# Patient Record
Sex: Female | Born: 1935 | Race: White | Hispanic: No | Marital: Married | Smoking: Former smoker
Health system: Southern US, Community
[De-identification: ages and names within clinical notes are randomized; demographics above are authoritative.]

## PROBLEM LIST (undated history)

## (undated) DIAGNOSIS — F0390 Unspecified dementia without behavioral disturbance: Secondary | ICD-10-CM

## (undated) DIAGNOSIS — G20 Parkinson's disease: Secondary | ICD-10-CM

## (undated) HISTORY — PX: BACK SURGERY: SHX140

## (undated) HISTORY — PX: HIP SURGERY: SHX245

## (undated) HISTORY — PX: KNEE SURGERY: SHX244

---

## 2016-03-11 ENCOUNTER — Inpatient Hospital Stay (HOSPITAL_COMMUNITY)
Admission: EM | Admit: 2016-03-11 | Discharge: 2016-03-17 | DRG: 871 | Disposition: A | Discharge status: Home / Self Care | Payer: Medicare (Managed Care) | Attending: Internal Medicine | Admitting: Internal Medicine

## 2016-03-11 ENCOUNTER — Encounter (HOSPITAL_COMMUNITY): Payer: Self-pay

## 2016-03-11 ENCOUNTER — Emergency Department (HOSPITAL_COMMUNITY): Payer: Medicare (Managed Care)

## 2016-03-11 DIAGNOSIS — Z87891 Personal history of nicotine dependence: Secondary | ICD-10-CM | POA: Diagnosis not present

## 2016-03-11 DIAGNOSIS — G20 Parkinson's disease: Secondary | ICD-10-CM | POA: Diagnosis present

## 2016-03-11 DIAGNOSIS — J189 Pneumonia, unspecified organism: Secondary | ICD-10-CM | POA: Diagnosis present

## 2016-03-11 DIAGNOSIS — I10 Essential (primary) hypertension: Secondary | ICD-10-CM | POA: Diagnosis present

## 2016-03-11 DIAGNOSIS — R778 Other specified abnormalities of plasma proteins: Secondary | ICD-10-CM | POA: Diagnosis present

## 2016-03-11 DIAGNOSIS — B9789 Other viral agents as the cause of diseases classified elsewhere: Secondary | ICD-10-CM | POA: Diagnosis present

## 2016-03-11 DIAGNOSIS — I5181 Takotsubo syndrome: Secondary | ICD-10-CM | POA: Diagnosis present

## 2016-03-11 DIAGNOSIS — R197 Diarrhea, unspecified: Secondary | ICD-10-CM | POA: Diagnosis present

## 2016-03-11 DIAGNOSIS — J810 Acute pulmonary edema: Secondary | ICD-10-CM | POA: Diagnosis present

## 2016-03-11 DIAGNOSIS — J811 Chronic pulmonary edema: Secondary | ICD-10-CM | POA: Diagnosis present

## 2016-03-11 DIAGNOSIS — A419 Sepsis, unspecified organism: Secondary | ICD-10-CM | POA: Diagnosis present

## 2016-03-11 DIAGNOSIS — Z79899 Other long term (current) drug therapy: Secondary | ICD-10-CM | POA: Diagnosis not present

## 2016-03-11 DIAGNOSIS — J9601 Acute respiratory failure with hypoxia: Secondary | ICD-10-CM | POA: Diagnosis present

## 2016-03-11 DIAGNOSIS — R6520 Severe sepsis without septic shock: Secondary | ICD-10-CM | POA: Diagnosis present

## 2016-03-11 DIAGNOSIS — I214 Non-ST elevation (NSTEMI) myocardial infarction: Secondary | ICD-10-CM | POA: Diagnosis present

## 2016-03-11 DIAGNOSIS — F0390 Unspecified dementia without behavioral disturbance: Secondary | ICD-10-CM | POA: Diagnosis not present

## 2016-03-11 DIAGNOSIS — E872 Acidosis, unspecified: Secondary | ICD-10-CM

## 2016-03-11 DIAGNOSIS — J9600 Acute respiratory failure, unspecified whether with hypoxia or hypercapnia: Secondary | ICD-10-CM

## 2016-03-11 DIAGNOSIS — D696 Thrombocytopenia, unspecified: Secondary | ICD-10-CM | POA: Diagnosis present

## 2016-03-11 DIAGNOSIS — E876 Hypokalemia: Secondary | ICD-10-CM | POA: Diagnosis present

## 2016-03-11 DIAGNOSIS — R7989 Other specified abnormal findings of blood chemistry: Secondary | ICD-10-CM | POA: Diagnosis not present

## 2016-03-11 DIAGNOSIS — F0280 Dementia in other diseases classified elsewhere without behavioral disturbance: Secondary | ICD-10-CM | POA: Diagnosis present

## 2016-03-11 DIAGNOSIS — A4189 Other specified sepsis: Principal | ICD-10-CM | POA: Diagnosis present

## 2016-03-11 DIAGNOSIS — R739 Hyperglycemia, unspecified: Secondary | ICD-10-CM | POA: Diagnosis present

## 2016-03-11 DIAGNOSIS — R05 Cough: Secondary | ICD-10-CM | POA: Diagnosis not present

## 2016-03-11 DIAGNOSIS — R0989 Other specified symptoms and signs involving the circulatory and respiratory systems: Secondary | ICD-10-CM | POA: Diagnosis present

## 2016-03-11 HISTORY — DX: Parkinson's disease: G20

## 2016-03-11 HISTORY — DX: Unspecified dementia, unspecified severity, without behavioral disturbance, psychotic disturbance, mood disturbance, and anxiety: F03.90

## 2016-03-11 LAB — CBC WITH DIFFERENTIAL/PLATELET
Basophils Absolute: 0 10*3/uL (ref 0.0–0.1)
Basophils Relative: 0 %
EOS PCT: 0 %
Eosinophils Absolute: 0 10*3/uL (ref 0.0–0.7)
HEMATOCRIT: 47.9 % — AB (ref 36.0–46.0)
Hemoglobin: 15.4 g/dL — ABNORMAL HIGH (ref 12.0–15.0)
LYMPHS ABS: 1.2 10*3/uL (ref 0.7–4.0)
LYMPHS PCT: 11 %
MCH: 29.3 pg (ref 26.0–34.0)
MCHC: 32.2 g/dL (ref 30.0–36.0)
MCV: 91.1 fL (ref 78.0–100.0)
MONO ABS: 0.7 10*3/uL (ref 0.1–1.0)
Monocytes Relative: 7 %
NEUTROS ABS: 9 10*3/uL — AB (ref 1.7–7.7)
Neutrophils Relative %: 82 %
PLATELETS: 161 10*3/uL (ref 150–400)
RBC: 5.26 MIL/uL — ABNORMAL HIGH (ref 3.87–5.11)
RDW: 14.9 % (ref 11.5–15.5)
WBC: 10.9 10*3/uL — ABNORMAL HIGH (ref 4.0–10.5)

## 2016-03-11 LAB — COMPREHENSIVE METABOLIC PANEL
ALK PHOS: 73 U/L (ref 38–126)
ALT: 15 U/L (ref 14–54)
ANION GAP: 11 (ref 5–15)
AST: 25 U/L (ref 15–41)
Albumin: 3.7 g/dL (ref 3.5–5.0)
BILIRUBIN TOTAL: 0.7 mg/dL (ref 0.3–1.2)
BUN: 17 mg/dL (ref 6–20)
CALCIUM: 9.3 mg/dL (ref 8.9–10.3)
CO2: 22 mmol/L (ref 22–32)
Chloride: 107 mmol/L (ref 101–111)
Creatinine, Ser: 0.76 mg/dL (ref 0.44–1.00)
Glucose, Bld: 164 mg/dL — ABNORMAL HIGH (ref 65–99)
Potassium: 3.5 mmol/L (ref 3.5–5.1)
Sodium: 140 mmol/L (ref 135–145)
TOTAL PROTEIN: 6.9 g/dL (ref 6.5–8.1)

## 2016-03-11 LAB — I-STAT ARTERIAL BLOOD GAS, ED
ACID-BASE DEFICIT: 7 mmol/L — AB (ref 0.0–2.0)
BICARBONATE: 19.6 meq/L — AB (ref 20.0–24.0)
O2 SAT: 97 %
PH ART: 7.267 — AB (ref 7.350–7.450)
PO2 ART: 101 mmHg — AB (ref 80.0–100.0)
Patient temperature: 98.6
TCO2: 21 mmol/L (ref 0–100)
pCO2 arterial: 43.1 mmHg (ref 35.0–45.0)

## 2016-03-11 LAB — URINALYSIS, ROUTINE W REFLEX MICROSCOPIC
Bilirubin Urine: NEGATIVE
GLUCOSE, UA: NEGATIVE mg/dL
KETONES UR: 15 mg/dL — AB
LEUKOCYTES UA: NEGATIVE
Nitrite: NEGATIVE
PH: 5.5 (ref 5.0–8.0)
Specific Gravity, Urine: 1.028 (ref 1.005–1.030)

## 2016-03-11 LAB — TROPONIN I
TROPONIN I: 2.33 ng/mL — AB (ref <0.031)
Troponin I: 0.08 ng/mL — ABNORMAL HIGH (ref <0.031)

## 2016-03-11 LAB — I-STAT CG4 LACTIC ACID, ED
LACTIC ACID, VENOUS: 4.65 mmol/L — AB (ref 0.5–2.0)
Lactic Acid, Venous: 3.17 mmol/L (ref 0.5–2.0)
Lactic Acid, Venous: 2.69 mmol/L (ref 0.5–2.0)

## 2016-03-11 LAB — CBC
HCT: 43.2 % (ref 36.0–46.0)
Hemoglobin: 13.7 g/dL (ref 12.0–15.0)
MCH: 28.6 pg (ref 26.0–34.0)
MCHC: 31.7 g/dL (ref 30.0–36.0)
MCV: 90.2 fL (ref 78.0–100.0)
PLATELETS: 134 10*3/uL — AB (ref 150–400)
RBC: 4.79 MIL/uL (ref 3.87–5.11)
RDW: 14.9 % (ref 11.5–15.5)
WBC: 13.7 10*3/uL — AB (ref 4.0–10.5)

## 2016-03-11 LAB — TYPE AND SCREEN
ABO/RH(D): B POS
ANTIBODY SCREEN: NEGATIVE

## 2016-03-11 LAB — LIPASE, BLOOD: Lipase: 26 U/L (ref 11–51)

## 2016-03-11 LAB — I-STAT TROPONIN, ED: TROPONIN I, POC: 0.85 ng/mL — AB (ref 0.00–0.08)

## 2016-03-11 LAB — URINE MICROSCOPIC-ADD ON: BACTERIA UA: NONE SEEN

## 2016-03-11 LAB — PROCALCITONIN: PROCALCITONIN: 1.66 ng/mL

## 2016-03-11 LAB — CREATININE, SERUM
CREATININE: 0.81 mg/dL (ref 0.44–1.00)
GFR calc Af Amer: >60 mL/min (ref >60)
GFR calc non Af Amer: >60 mL/min (ref >60)

## 2016-03-11 LAB — MAGNESIUM: MAGNESIUM: 1.8 mg/dL (ref 1.7–2.4)

## 2016-03-11 LAB — BRAIN NATRIURETIC PEPTIDE: B Natriuretic Peptide: 107.7 pg/mL — ABNORMAL HIGH (ref 0.0–100.0)

## 2016-03-11 LAB — PHOSPHORUS: Phosphorus: 3.5 mg/dL (ref 2.5–4.6)

## 2016-03-11 MED ORDER — SODIUM CHLORIDE 0.9 % IV BOLUS (SEPSIS)
1000.0000 mL | Freq: Once | INTRAVENOUS | Status: AC
  Administered 2016-03-11: 1000 mL via INTRAVENOUS

## 2016-03-11 MED ORDER — ASPIRIN 325 MG PO TABS: 325.0000 mg | ORAL_TABLET | Freq: Once | ORAL | Status: DC

## 2016-03-11 MED ORDER — DEXTROSE 5 % IV SOLN
1.0000 g | Freq: Once | INTRAVENOUS | Status: AC
  Administered 2016-03-11: 1 g via INTRAVENOUS
  Filled 2016-03-11: qty 10

## 2016-03-11 MED ORDER — METOPROLOL TARTRATE 5 MG/5ML IV SOLN
2.5000 mg | Freq: Four times a day (QID) | INTRAVENOUS | Status: DC
  Administered 2016-03-12 (×2): 2.5 mg via INTRAVENOUS
  Filled 2016-03-11 (×5): qty 5

## 2016-03-11 MED ORDER — INSULIN ASPART 100 UNIT/ML ~~LOC~~ SOLN: 0.0000 [IU] | Freq: Every day | SUBCUTANEOUS | Status: DC

## 2016-03-11 MED ORDER — VANCOMYCIN HCL 500 MG IV SOLR
500.0000 mg | Freq: Two times a day (BID) | INTRAVENOUS | Status: DC
  Administered 2016-03-12: 500 mg via INTRAVENOUS
  Filled 2016-03-11 (×2): qty 500

## 2016-03-11 MED ORDER — VANCOMYCIN HCL IN DEXTROSE 1-5 GM/200ML-% IV SOLN
1000.0000 mg | Freq: Once | INTRAVENOUS | Status: AC
  Administered 2016-03-12: 1000 mg via INTRAVENOUS
  Filled 2016-03-11: qty 200

## 2016-03-11 MED ORDER — ASPIRIN 300 MG RE SUPP: 150.0000 mg | Freq: Every day | RECTAL | Status: DC

## 2016-03-11 MED ORDER — HEPARIN (PORCINE) IN NACL 100-0.45 UNIT/ML-% IJ SOLN
750.0000 [IU]/h | INTRAMUSCULAR | Status: DC
  Administered 2016-03-11: 750 [IU]/h via INTRAVENOUS
  Filled 2016-03-11: qty 250

## 2016-03-11 MED ORDER — HEPARIN (PORCINE) IN NACL 100-0.45 UNIT/ML-% IJ SOLN
750.0000 [IU]/h | INTRAMUSCULAR | Status: DC
  Administered 2016-03-12: 750 [IU]/h via INTRAVENOUS
  Filled 2016-03-11 (×4): qty 250

## 2016-03-11 MED ORDER — METRONIDAZOLE IN NACL 5-0.79 MG/ML-% IV SOLN
500.0000 mg | Freq: Three times a day (TID) | INTRAVENOUS | Status: DC
  Administered 2016-03-11 – 2016-03-13 (×6): 500 mg via INTRAVENOUS
  Filled 2016-03-11 (×8): qty 100

## 2016-03-11 MED ORDER — NITROGLYCERIN IN D5W 200-5 MCG/ML-% IV SOLN
0.0000 ug/min | Freq: Once | INTRAVENOUS | Status: AC
  Administered 2016-03-11: 5 ug/min via INTRAVENOUS
  Filled 2016-03-11: qty 250

## 2016-03-11 MED ORDER — VANCOMYCIN 50 MG/ML ORAL SOLUTION
125.0000 mg | Freq: Four times a day (QID) | ORAL | Status: DC
  Administered 2016-03-12 (×2): 125 mg via ORAL
  Filled 2016-03-11 (×5): qty 2.5

## 2016-03-11 MED ORDER — DEXTROSE 5 % IV SOLN
500.0000 mg | INTRAVENOUS | Status: DC
  Administered 2016-03-11 – 2016-03-14 (×4): 500 mg via INTRAVENOUS
  Filled 2016-03-11 (×6): qty 500

## 2016-03-11 MED ORDER — LORAZEPAM 2 MG/ML IJ SOLN
1.0000 mg | Freq: Once | INTRAMUSCULAR | Status: AC
  Administered 2016-03-11: 1 mg via INTRAVENOUS
  Filled 2016-03-11: qty 1

## 2016-03-11 MED ORDER — HEPARIN BOLUS VIA INFUSION
3500.0000 [IU] | Freq: Once | INTRAVENOUS | Status: AC
  Administered 2016-03-11: 3500 [IU] via INTRAVENOUS
  Filled 2016-03-11: qty 3500

## 2016-03-11 MED ORDER — ACETAMINOPHEN 325 MG PO TABS
650.0000 mg | ORAL_TABLET | Freq: Once | ORAL | Status: AC | PRN
  Administered 2016-03-11: 650 mg via ORAL

## 2016-03-11 MED ORDER — SODIUM CHLORIDE 0.9 % IV SOLN: 250.0000 mL | INTRAVENOUS | Status: DC | PRN

## 2016-03-11 MED ORDER — INSULIN ASPART 100 UNIT/ML ~~LOC~~ SOLN: 0.0000 [IU] | Freq: Three times a day (TID) | SUBCUTANEOUS | Status: DC

## 2016-03-11 MED ORDER — FUROSEMIDE 10 MG/ML IJ SOLN
20.0000 mg | Freq: Once | INTRAMUSCULAR | Status: AC
  Administered 2016-03-11: 20 mg via INTRAVENOUS
  Filled 2016-03-11: qty 2

## 2016-03-11 MED ORDER — DEXTROSE 5 % IV SOLN
2.0000 g | Freq: Two times a day (BID) | INTRAVENOUS | Status: DC
  Administered 2016-03-12 – 2016-03-13 (×4): 2 g via INTRAVENOUS
  Filled 2016-03-11 (×5): qty 2

## 2016-03-11 MED ORDER — ENOXAPARIN SODIUM 40 MG/0.4ML ~~LOC~~ SOLN
40.0000 mg | SUBCUTANEOUS | Status: DC
  Filled 2016-03-11: qty 0.4

## 2016-03-11 MED ORDER — ASPIRIN 300 MG RE SUPP
150.0000 mg | Freq: Every day | RECTAL | Status: DC
  Administered 2016-03-12: 150 mg via RECTAL
  Filled 2016-03-11 (×2): qty 1

## 2016-03-11 MED ORDER — IOPAMIDOL (ISOVUE-370) INJECTION 76%
INTRAVENOUS | Status: AC
  Filled 2016-03-11: qty 100

## 2016-03-11 MED ORDER — ACETAMINOPHEN 325 MG PO TABS
ORAL_TABLET | ORAL | Status: AC
  Filled 2016-03-11: qty 2

## 2016-03-11 NOTE — ED Notes (Signed)
Asked MD to order antibiotics for CODE SEPSIS.

## 2016-03-11 NOTE — ED Notes (Signed)
Admitting md aware of elevated troponin and lactic acid.

## 2016-03-11 NOTE — Progress Notes (Signed)
RT spopke with MD about removing bipap and to see if pt can tolerate a [place].  Rt removed bipap and placed pt on a 3lpm [place].  Pt has been coughing with a congested cough but sats remain 99-100% on 3lpm.  Pt sounds as if she may have an upper airway obstruction from the sounds ausculated on the neck.  BS arer rhonchi and crackly.  RT will continue to monitor.

## 2016-03-11 NOTE — Progress Notes (Signed)
Pharmacy Antibiotic Note  Cassandra Tanner is a 82 y.o. female admitted on 03/11/2016 with sepsis.  Pharmacy has been consulted for vancomycin dosing.  Plan: Vancomycin 500mg  IV every 12 hours.  Goal trough 15-20 mcg/mL.  Cefepime 2g IV q12h Monitor culture data, renal function and clinical course VT at SS prn  Height: 5' (152.4 cm) Weight: 135 lb (61.236 kg) IBW/kg (Calculated) : 45.5  Temp (24hrs), Avg:101.4 F (38.6 C), Min:101 F (38.3 C), Max:101.7 F (38.7 C)   Recent Labs Lab 03/11/16 1514 03/11/16 1545 03/11/16 1739 03/11/16 2056  WBC 10.9*  --   --   --   CREATININE 0.76  --   --   --   LATICACIDVEN  --  3.17* 4.65* 2.69*    Estimated Creatinine Clearance: 43.6 mL/min (by C-G formula based on Cr of 0.76).    No Known Allergies  Antimicrobials this admission: Vanc 5/28 >>  Cefepime 5/28 >>  Flagyl 5/28 >> Azith 5/28 CTX 5/28  Dose adjustments this admission: n/a  Microbiology results: 5/28 BCx: sent 5/28 UCx: sent   Sputum:    MRSA PCR:   Thank you for allowing pharmacy to be a part of this patient's care.  Marquita PalmsBall, Tarl Cephas M 03/11/2016 10:32 PM

## 2016-03-11 NOTE — Progress Notes (Signed)
ANTICOAGULATION CONSULT NOTE - Initial Consult  Pharmacy Consult for heparin Indication: chest pain/ACS  No Known Allergies  Patient Measurements: Height: 5' (152.4 cm) Weight: 135 lb (61.236 kg) IBW/kg (Calculated) : 45.5 Heparin Dosing Weight: 58 kg   Vital Signs: Temp: 101.7 F (38.7 C) (05/28 1553) Temp Source: Rectal (05/28 1553) BP: 109/76 mmHg (05/28 1745) Pulse Rate: 130 (05/28 1745)  Labs:  Recent Labs  03/11/16 1514 03/11/16 1723  HGB 15.4*  --   HCT 47.9*  --   PLT 161  --   CREATININE 0.76  --   TROPONINI  --  0.08*    Estimated Creatinine Clearance: 43.6 mL/min (by C-G formula based on Cr of 0.76).   Medical History: Past Medical History  Diagnosis Date  . Parkinson's disease (HCC)   . Dementia     Medications:   (Not in a hospital admission)  Assessment: Starting heparin gtt for ?ACS CBC stable Renal fx stable, eCrCl 40-50 ml/min LA 4.6, trop positive  Goal of Therapy:  Heparin level 0.3-0.7 units/ml Monitor platelets by anticoagulation protocol: Yes    Plan:  -Heparin 3500 units x1 then 750 units/hr -Daily HL, CBC -First level with AM labs -F/u cardiology plans    Baldemar FridayMasters, Lajuanda Penick M 03/11/2016,5:57 PM

## 2016-03-11 NOTE — ED Notes (Signed)
Aggie Cosierheresa from [facility] called with Lactic acid 3.17, Dr. Clayborne DanaMesner called and made aware.

## 2016-03-11 NOTE — ED Notes (Signed)
Code Sepsis activated @ 1555

## 2016-03-11 NOTE — ED Notes (Signed)
Spoke with [facility], regarding troponin.

## 2016-03-11 NOTE — ED Notes (Signed)
Critical care md at bedside  

## 2016-03-11 NOTE — ED Provider Notes (Signed)
CSN: [id number]     Arrival date & time 03/11/16  1443 History   First MD Initiated Contact with Patient 03/11/16 1525     Chief Complaint  Patient presents with  . Cough    Patient is a 82 y.o. female presenting with cough and shortness of breath. The history is provided by the patient and the spouse.  Cough Cough characteristics:  Productive Sputum characteristics:  Clear Severity:  Moderate Onset quality:  Gradual Duration:  2 days Timing:  Constant Progression:  Worsening Chronicity:  New Smoker: no   Context comment:  Immobilization, flight, hotel Associated symptoms: chills, fever, myalgias and shortness of breath   Associated symptoms: no chest pain, no diaphoresis, no headaches, no rash, no rhinorrhea, no sore throat and no wheezing   Risk factors: recent travel (flew from [place] prior to onset)   Risk factors: no recent infection   Shortness of Breath Severity:  Moderate Duration:  2 days Timing:  Constant Progression:  Worsening Chronicity:  New Associated symptoms: cough and fever   Associated symptoms: no abdominal pain, no chest pain, no diaphoresis, no headaches, no rash, no sore throat, no vomiting and no wheezing   Risk factors: prolonged immobilization   Risk factors: no family hx of DVT, no hx of cancer and no hx of PE/DVT   Risk factors comment:  Parkinsons, dementia   Past Medical History  Diagnosis Date  . Parkinson's disease (HCC)   . Dementia    Past Surgical History  Procedure Laterality Date  . Back surgery    . Hip surgery    . Knee surgery     No family history on file. Social History  Substance Use Topics  . Smoking status: Not on file  . Smokeless tobacco: Not on file  . Alcohol Use: Not on file   OB History    No data available     Review of Systems  Constitutional: Positive for fever, chills and fatigue. Negative for diaphoresis, activity change and appetite change.  HENT: Negative for congestion, rhinorrhea and sore throat.    Respiratory: Positive for cough and shortness of breath. Negative for wheezing.   Cardiovascular: Negative for chest pain and leg swelling.  Gastrointestinal: Negative for nausea, vomiting and abdominal pain.  Genitourinary: Positive for dysuria. Negative for frequency.       Strong odor   Musculoskeletal: Positive for myalgias. Negative for back pain.  Skin: Negative for rash.  Neurological: Negative for syncope, weakness and headaches.  Psychiatric/Behavioral: Negative for confusion (baseline dementia with short term memory impairments.  At baseline).  All other systems reviewed and are negative.     Allergies  Review of patient's allergies indicates no known allergies.  Home Medications   Prior to Admission medications   Medication Sig Start Date End Date Taking? Authorizing Provider  Cholecalciferol (VITAMIN D-3) 1000 units CAPS Take 1,000 Units by mouth every morning.   Yes Historical Provider, MD  donepezil (ARICEPT) 5 MG tablet Take 5 mg by mouth at bedtime.   Yes Historical Provider, MD  escitalopram (LEXAPRO) 10 MG tablet Take 10 mg by mouth at bedtime.   Yes Historical Provider, MD  gabapentin (NEURONTIN) 300 MG capsule Take 300 mg by mouth at bedtime.   Yes Historical Provider, MD  vitamin C (ASCORBIC ACID) 500 MG tablet Take 500 mg by mouth every morning.   Yes Historical Provider, MD   BP 110/70 mmHg  Pulse 97  Temp(Src) 98.3 F (36.8 C) (Oral)  Resp 21  Ht 5' (1.524 m)  Wt 61.236 kg  BMI 26.37 kg/m2  SpO2 97% Physical Exam  Constitutional: She is oriented to person, place, and time. No distress.  elderly  HENT:  Head: Normocephalic and atraumatic.  Nose: Nose normal.  Eyes: Conjunctivae are normal.  Neck: Normal range of motion. Neck supple. No JVD present. No tracheal deviation present.  Cardiovascular: Regular rhythm, normal heart sounds and intact distal pulses.   No murmur heard. tachy  Pulmonary/Chest: She has no wheezes. She has rales (bilateral  diffusely).  Tachypneic (mild), nml exp phase, does not appear labored  Abdominal: Soft. Bowel sounds are normal. She exhibits no distension and no mass. There is no tenderness.  Musculoskeletal: Normal range of motion. She exhibits no edema.  No lower extremity edema, calf tenderness, warmth, erythema or palpable cords   Neurological: She is alert and oriented to person, place, and time.  MAE  Skin: Skin is warm and dry. No rash noted.  Psychiatric: She has a normal mood and affect.  Nursing note and vitals reviewed.   ED Course  Procedures (including critical care time) Labs Review Labs Reviewed  COMPREHENSIVE METABOLIC PANEL - Abnormal; Notable for the following:    Glucose, Bld 164 (*)    All other components within normal limits  CBC WITH DIFFERENTIAL/PLATELET - Abnormal; Notable for the following:    WBC 10.9 (*)    RBC 5.26 (*)    Hemoglobin 15.4 (*)    HCT 47.9 (*)    Neutro Abs 9.0 (*)    All other components within normal limits  URINALYSIS, ROUTINE W REFLEX MICROSCOPIC (NOT AT [facility]) - Abnormal; Notable for the following:    APPearance CLOUDY (*)    Hgb urine dipstick SMALL (*)    Ketones, ur 15 (*)    Protein, ur >300 (*)    All other components within normal limits  BRAIN NATRIURETIC PEPTIDE - Abnormal; Notable for the following:    B Natriuretic Peptide 107.7 (*)    All other components within normal limits  TROPONIN I - Abnormal; Notable for the following:    Troponin I 0.08 (*)    All other components within normal limits  URINE MICROSCOPIC-ADD ON - Abnormal; Notable for the following:    Squamous Epithelial / LPF 0-5 (*)    Casts HYALINE CASTS (*)    All other components within normal limits  TROPONIN I - Abnormal; Notable for the following:    Troponin I 2.33 (*)    All other components within normal limits  CBC - Abnormal; Notable for the following:    WBC 13.7 (*)    Platelets 134 (*)    All other components within normal limits  I-STAT CG4 LACTIC  ACID, ED - Abnormal; Notable for the following:    Lactic Acid, Venous 3.17 (*)    All other components within normal limits  I-STAT TROPOININ, ED - Abnormal; Notable for the following:    Troponin i, poc 0.85 (*)    All other components within normal limits  I-STAT CG4 LACTIC ACID, ED - Abnormal; Notable for the following:    Lactic Acid, Venous 4.65 (*)    All other components within normal limits  I-STAT CG4 LACTIC ACID, ED - Abnormal; Notable for the following:    Lactic Acid, Venous 2.69 (*)    All other components within normal limits  I-STAT ARTERIAL BLOOD GAS, ED - Abnormal; Notable for the following:    pH, Arterial 7.267 (*)  pO2, Arterial 101.0 (*)    Bicarbonate 19.6 (*)    Acid-base deficit 7.0 (*)    All other components within normal limits  CULTURE, BLOOD (ROUTINE X 2)  CULTURE, BLOOD (ROUTINE X 2)  URINE CULTURE  C DIFFICILE QUICK SCREEN W PCR REFLEX  GASTROINTESTINAL PANEL BY PCR, STOOL (REPLACES STOOL CULTURE)  MRSA PCR SCREENING  PROCALCITONIN  CREATININE, SERUM  MAGNESIUM  PHOSPHORUS  LIPASE, BLOOD  BLOOD GAS, ARTERIAL  CBC  BASIC METABOLIC PANEL  MAGNESIUM  PHOSPHORUS  FECAL LACTOFERRIN  TROPONIN I  TROPONIN I  HEPARIN LEVEL (UNFRACTIONATED)  TROPONIN I  I-STAT CG4 LACTIC ACID, ED  TYPE AND SCREEN  ABO/RH    Imaging Review Dg Chest 2 View  03/11/2016  CLINICAL DATA:  possible sepsis EXAM: CHEST  2 VIEW COMPARISON:  None. FINDINGS: Lumbar spine fixation. Cervical spine fixation. Midline trachea. Borderline cardiomegaly. Atherosclerosis in the transverse aorta. No pleural effusion or pneumothorax. Mild biapical pleural thickening. Diffuse pulmonary interstitial thickening. Suspicion of more focal inferior right upper lobe patchy airspace disease. IMPRESSION: Possible posterior right upper lobe airspace disease. This is superimposed upon presumably chronic nonspecific interstitial thickening. Depending on clinical symptoms, consider radiographic  follow-up in 2-4 days. Borderline cardiomegaly with aortic atherosclerosis. Electronically Signed   By: Jeronimo GreavesKyle  Talbot M.D.   On: 03/11/2016 15:54   Ct Angio Chest Pe W/cm &/or Wo Cm  03/11/2016  CLINICAL DATA:  Acute onset of congestion, cough, shortness of breath and fever. Initial encounter. EXAM: CT ANGIOGRAPHY CHEST WITH CONTRAST TECHNIQUE: Multidetector CT imaging of the chest was performed using the standard protocol during bolus administration of intravenous contrast. Multiplanar CT image reconstructions and MIPs were obtained to evaluate the vascular anatomy. CONTRAST:  100 mL of Isovue 300 IV contrast COMPARISON:  Chest radiograph performed earlier today at 5:22 p.m. FINDINGS: There is no evidence of pulmonary embolus. Minimal bilateral atelectasis is noted. The lungs are otherwise clear. A prominent bleb is noted at the left upper lobe. There is no evidence of significant focal consolidation, pleural effusion or pneumothorax. No masses are identified; no abnormal focal contrast enhancement is seen. Mild peribronchial thickening is noted, without a dominant mass. The mediastinum is grossly unremarkable in appearance. Visualized mediastinal nodes remain normal in size. Scattered calcification is seen along the aortic arch and descending thoracic aorta. The great vessels are grossly unremarkable in appearance. No axillary lymphadenopathy is seen. The visualized portions of the thyroid gland are unremarkable in appearance. The visualized portions of the liver and spleen are unremarkable. The patient is status post cholecystectomy, with clips noted at the gallbladder fossa. The visualized portions of the pancreas, adrenal glands and right kidney are grossly unremarkable. No acute osseous abnormalities are seen. Lumbar spinal fusion hardware is partially imaged, somewhat superiorly angulated. Multilevel vacuum phenomenon is noted along the lower thoracic spine. Review of the MIP images confirms the above  findings. IMPRESSION: 1. No evidence of pulmonary embolus. 2. Minimal bilateral atelectasis noted. Lungs otherwise clear. Prominent bleb at the left upper lobe. 3. Mild peribronchial thickening noted. 4. Scattered calcification along the aortic arch and descending thoracic aorta. 5. Degenerative change along the lower thoracic spine. Electronically Signed   By: Roanna RaiderJeffery  Chang M.D.   On: 03/11/2016 19:04   Dg Chest Portable 1 View  03/11/2016  CLINICAL DATA:  Shortness of Breath EXAM: PORTABLE CHEST 1 VIEW COMPARISON:  03/11/2016 FINDINGS: Cardiac shadow is stable. The lungs are again well aerated. Increasing vascular congestion with new mild interstitial edema is  noted. There is some improved aeration in the right upper lobe when compared with the prior exam. Postsurgical changes are again seen. IMPRESSION: Increased vascular congestion with interstitial edema. Electronically Signed   By: Alcide Clever M.D.   On: 03/11/2016 17:40   I have personally reviewed and evaluated these images and lab results as part of my medical decision-making.   EKG Interpretation   Date/Time:  Sunday Mar 11 2016 14:49:18 EDT Ventricular Rate:  163 PR Interval:  80 QRS Duration: 6 QT Interval:  112 QTC Calculation: 184 R Axis:   0 Text Interpretation:  Undetermined rhythm Indeterminate axis Pulmonary  disease pattern Nonspecific ST and T wave abnormality Abnormal ECG  Confirmed by [facility] MD, Barbara Cower [phone]) on 03/11/2016 3:33:50 PM      MDM   Final diagnoses:  Sepsis, due to unspecified organism (HCC)  Flash pulmonary edema (HCC)  NSTEMI (non-ST elevated myocardial infarction) (HCC)  Lactic acidosis  Acute hypoxemic respiratory failure (HCC)   Meets SIRS criteria.  Not labored. Dementia with short term memory impairments, oriented x, at baseline.  +resp symptoms. Consider PE given recent immobilization, tachy, hypoxia. CXR wo consolidation, but some interstitial markings/ fluid could be related to PNA, will  have better idea after PE study.  Hypoxic in high 80s, placed on 2L [place]. Does not appear volume up, no JVD, leg swelling and no hx of heart problems.    After 1L NS, pt developed flash pulm edema with HTN, significant rales, resp distress and hypoxia.  Placed on NIPPV and NTG with lasix which improved pt clinical status.  Was able to take off NTG and BIPAP.    CTA study neg for PE/ PNA.  Consider cardiogenic source given elevated trop and acute pulm edema.  Started hep gtt.  EKG wo STEMI.  No chest pain.  Lactic acid trended up, source not definitively clear. Bacteremia? UA neg for infx markers. Abd nontender.  Given abx early in course. Defer 30 cckg bolus for pulm edema, unable to tolerate more fluid.  Critical care consulted and will admit.    Sofie Rower, MD 03/12/16 [id number]  Marily Memos, MD 03/12/16 2020

## 2016-03-11 NOTE — H&P (Addendum)
PULMONARY / CRITICAL CARE MEDICINE   Name: Cassandra Tanner MRN: [id number] DOB: 1930/01/28    ADMISSION DATE:  03/11/2016  CHIEF COMPLAINT:  Dyspnea  HISTORY OF PRESENT ILLNESS:   Cassandra [place] is an 82 y/o [place] when she developed worsening dyspnea, fevers, altered mental status, and abdominal pain. Her husband reports she was in her usual state of health with no recent changes until about 48 hours prior to presentation when she developed diarrhea with loose BMs for several days. She reported abd pain with poor PO intake for about 24 hours prior to presentation. She also developed "gurgling" sounds while breathing, and had increased WOB. Her family was worried and brought to the ED.  PAST MEDICAL HISTORY :  She  has a past medical history of Parkinson's disease (HCC) and Dementia.  PAST SURGICAL HISTORY: She  has past surgical history that includes Back surgery; Hip surgery; and Knee surgery.  No Known Allergies  No current facility-administered medications on file prior to encounter.   No current outpatient prescriptions on file prior to encounter.    FAMILY HISTORY:  Her has no family status information on file.   SOCIAL HISTORY: She lives in an assisted living community.  REVIEW OF SYSTEMS:   Her family reports she had been complaining of abd pain and loose BMs for last several days.  SUBJECTIVE:  N/A  VITAL SIGNS: BP 106/77 mmHg  Pulse 94  Temp(Src) 101.7 F (38.7 C) (Rectal)  Resp 23  Ht 5' (1.524 m)  Wt 135 lb (61.236 kg)  BMI 26.37 kg/m2  SpO2 98%  HEMODYNAMICS:    VENTILATOR SETTINGS: Vent Mode:  [-] BIPAP FiO2 (%):  [40 %] 40 %  INTAKE / OUTPUT:    PHYSICAL EXAMINATION: General:  Elderly woman, somnolent, but easily arousable. Neuro:  Oriented to self only. Difficulty hearing. HEENT:  Dried secretions on lips, foul-smelling breath Cardiovascular:  Heart sounds dual and normal. Lungs:   CTAB. Abdomen:  Soft, mildly tender Musculoskeletal:  No swollen joints Skin:  No visible rashes.  LABS:  BMET  Recent Labs Lab 03/11/16 1514  NA 140  K 3.5  CL 107  CO2 22  BUN 17  CREATININE 0.76  GLUCOSE 164*    Electrolytes  Recent Labs Lab 03/11/16 1514  CALCIUM 9.3    CBC  Recent Labs Lab 03/11/16 1514  WBC 10.9*  HGB 15.4*  HCT 47.9*  PLT 161    Coag's No results for input(s): APTT, INR in the last 168 hours.  Sepsis Markers  Recent Labs Lab 03/11/16 1545 03/11/16 1739 03/11/16 2056  LATICACIDVEN 3.17* 4.65* 2.69*    ABG  Recent Labs Lab 03/11/16 2036  PHART 7.267*  PCO2ART 43.1  PO2ART 101.0*    Liver Enzymes  Recent Labs Lab 03/11/16 1514  AST 25  ALT 15  ALKPHOS 73  BILITOT 0.7  ALBUMIN 3.7    Cardiac Enzymes  Recent Labs Lab 03/11/16 1723 03/11/16 2050  TROPONINI 0.08* 2.33*    Glucose No results for input(s): GLUCAP in the last 168 hours.  Imaging Dg Chest 2 View  03/11/2016  CLINICAL DATA:  possible sepsis EXAM: CHEST  2 VIEW COMPARISON:  None. FINDINGS: Lumbar spine fixation. Cervical spine fixation. Midline trachea. Borderline cardiomegaly. Atherosclerosis in the transverse aorta. No pleural effusion or pneumothorax. Mild biapical pleural thickening. Diffuse pulmonary interstitial thickening. Suspicion of more focal inferior right upper lobe patchy airspace disease. IMPRESSION: Possible posterior right  upper lobe airspace disease. This is superimposed upon presumably chronic nonspecific interstitial thickening. Depending on clinical symptoms, consider radiographic follow-up in 2-4 days. Borderline cardiomegaly with aortic atherosclerosis. Electronically Signed   By: Jeronimo Greaves M.D.   On: 03/11/2016 15:54   Ct Angio Chest Pe W/cm &/or Wo Cm  03/11/2016  CLINICAL DATA:  Acute onset of congestion, cough, shortness of breath and fever. Initial encounter. EXAM: CT ANGIOGRAPHY CHEST WITH CONTRAST TECHNIQUE:  Multidetector CT imaging of the chest was performed using the standard protocol during bolus administration of intravenous contrast. Multiplanar CT image reconstructions and MIPs were obtained to evaluate the vascular anatomy. CONTRAST:  100 mL of Isovue 300 IV contrast COMPARISON:  Chest radiograph performed earlier today at 5:22 p.m. FINDINGS: There is no evidence of pulmonary embolus. Minimal bilateral atelectasis is noted. The lungs are otherwise clear. A prominent bleb is noted at the left upper lobe. There is no evidence of significant focal consolidation, pleural effusion or pneumothorax. No masses are identified; no abnormal focal contrast enhancement is seen. Mild peribronchial thickening is noted, without a dominant mass. The mediastinum is grossly unremarkable in appearance. Visualized mediastinal nodes remain normal in size. Scattered calcification is seen along the aortic arch and descending thoracic aorta. The great vessels are grossly unremarkable in appearance. No axillary lymphadenopathy is seen. The visualized portions of the thyroid gland are unremarkable in appearance. The visualized portions of the liver and spleen are unremarkable. The patient is status post cholecystectomy, with clips noted at the gallbladder fossa. The visualized portions of the pancreas, adrenal glands and right kidney are grossly unremarkable. No acute osseous abnormalities are seen. Lumbar spinal fusion hardware is partially imaged, somewhat superiorly angulated. Multilevel vacuum phenomenon is noted along the lower thoracic spine. Review of the MIP images confirms the above findings. IMPRESSION: 1. No evidence of pulmonary embolus. 2. Minimal bilateral atelectasis noted. Lungs otherwise clear. Prominent bleb at the left upper lobe. 3. Mild peribronchial thickening noted. 4. Scattered calcification along the aortic arch and descending thoracic aorta. 5. Degenerative change along the lower thoracic spine. Electronically  Signed   By: Roanna Raider M.D.   On: 03/11/2016 19:04   Dg Chest Portable 1 View  03/11/2016  CLINICAL DATA:  Shortness of Breath EXAM: PORTABLE CHEST 1 VIEW COMPARISON:  03/11/2016 FINDINGS: Cardiac shadow is stable. The lungs are again well aerated. Increasing vascular congestion with new mild interstitial edema is noted. There is some improved aeration in the right upper lobe when compared with the prior exam. Postsurgical changes are again seen. IMPRESSION: Increased vascular congestion with interstitial edema. Electronically Signed   By: Alcide Clever M.D.   On: 03/11/2016 17:40     STUDIES:  CT-PA: No PE, no infiltrate  CULTURES: Blood x 2 5/28 >> In process   ANTIBIOTICS: CTX 5/28 x1 Azithro 5/28 >> PO Vanc 5/28 >> IV Flagyl 5/28 >> Cefepime 5/29 >>  SIGNIFICANT EVENTS: She developed flash pulmonary edema in the ED, resolved  LINES/TUBES: PIV x2   DISCUSSION: 82 y/o woman with sepsis of unclear source, favor GI  ASSESSMENT / PLAN:  PULMONARY A: Increased WOB Relative respiratory acidosis P:   Unable to compensate for metabolic acidosis (lactic acidosis). Able to cough and presently demonstrating ability to protect airway, but course, audible secretions with heavy breathing concerning for threatened ability to protect airway. Low threshold to intubate. Discussed with family. Supplemental O2 via [place] for now.  CARDIOVASCULAR A:  NSTEMI type II Flash pulmonary edmea P:  No  hx of cardiac dz, but evidence of athroslcerotic dz on CT. Pt had clear episode of flash pulmonary edema which could easily account for degree of troponinemia (~2.0), but rest of clinical history does not fit with primary issue being AMI. Will trend troponin. Obtain echo. Consider cardiology consultation. Treat with ASA given low risk and potential benefit (even if small). However, will not tx with heparin given greater compared to ASA.  RENAL A:   Metabolic acidosis P:   Due to lactic acidosis  due to severe sepsis.  GASTROINTESTINAL A:   Possible infectious diarrhea P:   Empiric tx for C. Diff GI pathogen panel Enteric precautions. Maintain NPO status for now until more stablized  HEMATOLOGIC A:   Tx with Lovenox for DVT ppx P:  Holding heparin infusion (for NSTEMI given likely demand)  INFECTIOUS A:   Severe sepsis P:   Lactic acid initially uptrending, now improving. Favor GI source, poss C. Diff, although she has no recent abx exposure, she does live in a ALF with exposure to other elderly individuals. Empiric tx as above (Flagyl + PO Vanc) as well as broad spectrum abx (IV vanc + cefepime) until more data from cx and stool samples results.  ENDOCRINE A:   No active issues P:    NEUROLOGIC A:   Dementia P:   High risk for delirium. Will monitor   FAMILY  - Updates: Updated at bedside - Inter-disciplinary family meet or Palliative Care meeting due by:  June 5  CRITICAL CARE Performed by: Jamie Kato   Total critical care time: 60 minutes  Critical care time was exclusive of separately billable procedures and treating other patients.  Critical care was necessary to treat or prevent imminent or life-threatening deterioration.  Critical care was time spent personally by me on the following activities: development of treatment plan with patient and/or surrogate as well as nursing, discussions with consultants, evaluation of patient's response to treatment, examination of patient, obtaining history from patient or surrogate, ordering and performing treatments and interventions, ordering and review of laboratory studies, ordering and review of radiographic studies, pulse oximetry and re-evaluation of patient's condition.   Jamie Kato, MD Pulmonary and Critical Care Medicine [facility] Pager: [phone]  03/11/2016, 10:14 PM

## 2016-03-11 NOTE — Progress Notes (Addendum)
eLink Physician-Brief Progress Note Patient Name: Cassandra T[place] DOB: [birth year] MRN: [id number]   Date of Service  03/11/2016  HPI/Events of Note  Troponin = 0.08 >> 2.33. EKG with nonspecific ST-T changes. Findings c/w NSTEMI vs demand ischemia.   eICU Interventions  Will order: 1. ASA Suppository 150 mg PR now and Q day. 2. Heparin IV infusion per pharmacy consult. 3. Metoprolol 2.5 mg IV Q 6 hours. Hold for SBP < 100 or HR < 60. 4. Consult Cardiology/     Intervention Category Intermediate Interventions: Diagnostic test evaluation  Cassandra [place] 03/11/2016, 11:30 PM

## 2016-03-11 NOTE — ED Notes (Signed)
Attempted report 

## 2016-03-11 NOTE — ED Notes (Signed)
Report called to Willette AlmaJenn M on 2 MW

## 2016-03-11 NOTE — ED Notes (Signed)
Pt reports onset yesterday congested cough, shortness of breath, fever.  Pt and husband flew in from [place] yesterday.  Pt was well 2 days ago.

## 2016-03-12 ENCOUNTER — Inpatient Hospital Stay (HOSPITAL_COMMUNITY): Payer: Medicare (Managed Care)

## 2016-03-12 ENCOUNTER — Encounter (HOSPITAL_COMMUNITY): Payer: Self-pay | Admitting: Physician Assistant

## 2016-03-12 DIAGNOSIS — I5181 Takotsubo syndrome: Secondary | ICD-10-CM

## 2016-03-12 DIAGNOSIS — A419 Sepsis, unspecified organism: Secondary | ICD-10-CM

## 2016-03-12 DIAGNOSIS — I214 Non-ST elevation (NSTEMI) myocardial infarction: Secondary | ICD-10-CM | POA: Diagnosis present

## 2016-03-12 DIAGNOSIS — R7989 Other specified abnormal findings of blood chemistry: Secondary | ICD-10-CM | POA: Diagnosis present

## 2016-03-12 DIAGNOSIS — J810 Acute pulmonary edema: Secondary | ICD-10-CM | POA: Diagnosis present

## 2016-03-12 DIAGNOSIS — R778 Other specified abnormalities of plasma proteins: Secondary | ICD-10-CM | POA: Diagnosis present

## 2016-03-12 LAB — BASIC METABOLIC PANEL
ANION GAP: 9 (ref 5–15)
BUN: 17 mg/dL (ref 6–20)
CALCIUM: 8.1 mg/dL — AB (ref 8.9–10.3)
CO2: 21 mmol/L — ABNORMAL LOW (ref 22–32)
Chloride: 110 mmol/L (ref 101–111)
Creatinine, Ser: 0.75 mg/dL (ref 0.44–1.00)
GLUCOSE: 141 mg/dL — AB (ref 65–99)
Potassium: 3.7 mmol/L (ref 3.5–5.1)
Sodium: 140 mmol/L (ref 135–145)

## 2016-03-12 LAB — PROCALCITONIN: Procalcitonin: 2.27 ng/mL

## 2016-03-12 LAB — RESPIRATORY PANEL BY PCR
Adenovirus: NOT DETECTED
Bordetella pertussis: NOT DETECTED
CORONAVIRUS 229E-RVPPCR: NOT DETECTED
CORONAVIRUS HKU1-RVPPCR: NOT DETECTED
CORONAVIRUS OC43-RVPPCR: NOT DETECTED
Chlamydophila pneumoniae: NOT DETECTED
Coronavirus NL63: NOT DETECTED
INFLUENZA A H1 2009-RVPPR: NOT DETECTED
INFLUENZA A H3-RVPPCR: NOT DETECTED
INFLUENZA B-RVPPCR: NOT DETECTED
Influenza A H1: NOT DETECTED
Influenza A: NOT DETECTED
METAPNEUMOVIRUS-RVPPCR: NOT DETECTED
MYCOPLASMA PNEUMONIAE-RVPPCR: NOT DETECTED
PARAINFLUENZA VIRUS 1-RVPPCR: NOT DETECTED
PARAINFLUENZA VIRUS 2-RVPPCR: NOT DETECTED
Parainfluenza Virus 3: DETECTED — AB
Parainfluenza Virus 4: NOT DETECTED
Respiratory Syncytial Virus: NOT DETECTED
Rhinovirus / Enterovirus: NOT DETECTED

## 2016-03-12 LAB — GLUCOSE, CAPILLARY
GLUCOSE-CAPILLARY: 120 mg/dL — AB (ref 65–99)
GLUCOSE-CAPILLARY: 149 mg/dL — AB (ref 65–99)
GLUCOSE-CAPILLARY: 95 mg/dL (ref 65–99)
GLUCOSE-CAPILLARY: 98 mg/dL (ref 65–99)
Glucose-Capillary: 138 mg/dL — ABNORMAL HIGH (ref 65–99)

## 2016-03-12 LAB — TROPONIN I
TROPONIN I: 4 ng/mL — AB (ref <0.031)
TROPONIN I: 3.79 ng/mL — AB (ref <0.031)
Troponin I: 3.12 ng/mL (ref <0.031)

## 2016-03-12 LAB — CBC
HCT: 40.3 % (ref 36.0–46.0)
Hemoglobin: 12.8 g/dL (ref 12.0–15.0)
MCH: 28.6 pg (ref 26.0–34.0)
MCHC: 31.8 g/dL (ref 30.0–36.0)
MCV: 90.2 fL (ref 78.0–100.0)
PLATELETS: 140 10*3/uL — AB (ref 150–400)
RBC: 4.47 MIL/uL (ref 3.87–5.11)
RDW: 15 % (ref 11.5–15.5)
WBC: 10.8 10*3/uL — AB (ref 4.0–10.5)

## 2016-03-12 LAB — C DIFFICILE QUICK SCREEN W PCR REFLEX
C DIFFICILE (CDIFF) TOXIN: NEGATIVE
C Diff antigen: NEGATIVE
C Diff interpretation: NEGATIVE

## 2016-03-12 LAB — URINE CULTURE: Culture: NO GROWTH

## 2016-03-12 LAB — ABO/RH: ABO/RH(D): B POS

## 2016-03-12 LAB — MAGNESIUM: Magnesium: 1.7 mg/dL (ref 1.7–2.4)

## 2016-03-12 LAB — ECHOCARDIOGRAM COMPLETE
Height: 62 in
WEIGHTICAEL: 2377.44 [oz_av]

## 2016-03-12 LAB — MRSA PCR SCREENING: MRSA BY PCR: NEGATIVE

## 2016-03-12 LAB — HEPARIN LEVEL (UNFRACTIONATED)
HEPARIN UNFRACTIONATED: 0.38 [IU]/mL (ref 0.30–0.70)
Heparin Unfractionated: 0.27 IU/mL — ABNORMAL LOW (ref 0.30–0.70)

## 2016-03-12 LAB — LACTIC ACID, PLASMA: LACTIC ACID, VENOUS: 1.3 mmol/L (ref 0.5–2.0)

## 2016-03-12 LAB — PHOSPHORUS: PHOSPHORUS: 4 mg/dL (ref 2.5–4.6)

## 2016-03-12 MED ORDER — ASPIRIN EC 81 MG PO TBEC
162.0000 mg | DELAYED_RELEASE_TABLET | Freq: Every day | ORAL | Status: DC
  Administered 2016-03-12 – 2016-03-17 (×6): 162 mg via ORAL
  Filled 2016-03-12 (×7): qty 2

## 2016-03-12 MED ORDER — ESCITALOPRAM OXALATE 10 MG PO TABS
10.0000 mg | ORAL_TABLET | Freq: Every day | ORAL | Status: DC
  Administered 2016-03-12 – 2016-03-16 (×5): 10 mg via ORAL
  Filled 2016-03-12 (×5): qty 1

## 2016-03-12 MED ORDER — GABAPENTIN 600 MG PO TABS
300.0000 mg | ORAL_TABLET | Freq: Every day | ORAL | Status: DC
  Filled 2016-03-12: qty 0.5

## 2016-03-12 MED ORDER — HALOPERIDOL LACTATE 5 MG/ML IJ SOLN
1.0000 mg | INTRAMUSCULAR | Status: DC | PRN
  Administered 2016-03-12: 4 mg via INTRAVENOUS
  Filled 2016-03-12: qty 1

## 2016-03-12 MED ORDER — INSULIN ASPART 100 UNIT/ML ~~LOC~~ SOLN
0.0000 [IU] | SUBCUTANEOUS | Status: DC
  Administered 2016-03-12 – 2016-03-13 (×2): 1 [IU] via SUBCUTANEOUS
  Administered 2016-03-13: 2 [IU] via SUBCUTANEOUS
  Administered 2016-03-14 (×3): 1 [IU] via SUBCUTANEOUS

## 2016-03-12 MED ORDER — DIPHENHYDRAMINE HCL 50 MG/ML IJ SOLN
25.0000 mg | Freq: Four times a day (QID) | INTRAMUSCULAR | Status: DC | PRN
  Administered 2016-03-12: 25 mg via INTRAVENOUS
  Filled 2016-03-12: qty 1

## 2016-03-12 MED ORDER — ONDANSETRON HCL 4 MG/2ML IJ SOLN
4.0000 mg | Freq: Four times a day (QID) | INTRAMUSCULAR | Status: DC | PRN
  Administered 2016-03-13: 4 mg via INTRAVENOUS
  Filled 2016-03-12 (×2): qty 2

## 2016-03-12 MED ORDER — GABAPENTIN 300 MG PO CAPS
300.0000 mg | ORAL_CAPSULE | Freq: Every day | ORAL | Status: DC
  Administered 2016-03-12 – 2016-03-16 (×5): 300 mg via ORAL
  Filled 2016-03-12 (×6): qty 1

## 2016-03-12 MED ORDER — DONEPEZIL HCL 5 MG PO TABS
5.0000 mg | ORAL_TABLET | Freq: Every day | ORAL | Status: DC
  Administered 2016-03-12 – 2016-03-16 (×5): 5 mg via ORAL
  Filled 2016-03-12 (×5): qty 1

## 2016-03-12 NOTE — Progress Notes (Signed)
eLink Physician-Brief Progress Note Patient Name: Cassandra Tanner DOB: 1929/10/18 MRN: [id number]   Date of Service  03/12/2016  HPI/Events of Note  Itching s/p Vancomycin dose.  eICU Interventions  Will order Benadryl 25 mg IV Q 6 hours PRN itching.     Intervention Category Intermediate Interventions: Other:  Cassandra [place] Dennard Nipugene 03/12/2016, 4:40 AM

## 2016-03-12 NOTE — Progress Notes (Signed)
ANTICOAGULATION CONSULT NOTE - Initial Consult  Pharmacy Consult for heparin Indication: chest pain/ACS  No Known Allergies  Patient Measurements: Height: 5\' 2"  (157.5 cm) Weight: 148 lb 9.4 oz (67.4 kg) IBW/kg (Calculated) : 50.1 Heparin Dosing Weight: 58 kg   Vital Signs: Temp: 99.2 F (37.3 C) (05/29 1200) Temp Source: Oral (05/29 1200) BP: 113/83 mmHg (05/29 1200) Pulse Rate: 85 (05/29 1200)  Labs:  Recent Labs  03/11/16 1514 03/11/16 1723 03/11/16 2050 03/11/16 2214 03/12/16 0233 03/12/16 1135  HGB 15.4*  --   --  13.7 12.8  --   HCT 47.9*  --   --  43.2 40.3  --   PLT 161  --   --  134* 140*  --   HEPARINUNFRC  --   --   --   --   --  0.38  CREATININE 0.76  --   --  0.81 0.75  --   TROPONINI  --  0.08* 2.33*  --  4.00*  --     Estimated Creatinine Clearance: 47.9 mL/min (by C-G formula based on Cr of 0.75).   Medical History: Past Medical History  Diagnosis Date  . Parkinson's disease (HCC)   . Dementia     Medications:  Prescriptions prior to admission  Medication Sig Dispense Refill Last Dose  . Cholecalciferol (VITAMIN D-3) 1000 units CAPS Take 1,000 Units by mouth every morning.   03/09/2016 at Unknown time  . donepezil (ARICEPT) 5 MG tablet Take 5 mg by mouth at bedtime.   03/09/2016 at Unknown time  . escitalopram (LEXAPRO) 10 MG tablet Take 10 mg by mouth at bedtime.   03/09/2016 at Unknown time  . gabapentin (NEURONTIN) 300 MG capsule Take 300 mg by mouth at bedtime.   03/09/2016 at Unknown time  . vitamin C (ASCORBIC ACID) 500 MG tablet Take 500 mg by mouth every morning.   03/09/2016 at Unknown time    Assessment: 85 YOF on IV heparin for questionable ACS.  Trop up to 4 today. H/H and Plt remain stable. Currently on IV heparin at 750 units/hr. HL today is therapeutic at 0.38  Goal of Therapy:  Heparin level 0.3-0.7 units/ml Monitor platelets by anticoagulation protocol: Yes    Plan:  -Continue IV heparin at 750 units/hr -Daily HL,  CBC -Confirmatory level in 8 hours  -Cardiology to f/u ECHO results for plan of care    Vinnie LevelBenjamin Lamon Rotundo, PharmD., BCPS Clinical Pharmacist Pager [phone]

## 2016-03-12 NOTE — Progress Notes (Signed)
  Echocardiogram 2D Echocardiogram has been performed.  Cassandra T[place], Cassandra T[place] 03/12/2016, 10:56 AM

## 2016-03-12 NOTE — Progress Notes (Addendum)
eLink Physician-Brief Progress Note Patient Name: Willette Bracehyllis Navarrette DOB: [birth year] MRN: [id number]   Date of Service  03/12/2016  HPI/Events of Note  Pt more delirious, trying to get out of bed. Increasing tachypnea  eICU Interventions  Haldol PRN. Monitor qTC CXR Restraints, Sitter.  Cancel transfer to SDU.     Intervention Category Evaluation Type: Other  Bekki Tavenner 03/12/2016, 8:06 PM

## 2016-03-12 NOTE — Progress Notes (Signed)
PULMONARY / CRITICAL CARE MEDICINE   Name: Cassandra Tanner MRN: [id number] DOB: [birth year]    ADMISSION DATE:  03/11/2016  CHIEF COMPLAINT:  Dyspnea  HISTORY OF PRESENT ILLNESS:   Cassandra Tanner is an 82 y/o woman from [place] who was visiting [facility] for her grandson's wedding when she developed worsening dyspnea, fevers, altered mental status, and abdominal pain. Her husband reports she was in her usual state of health with no recent changes until about 48 hours prior to presentation when she developed diarrhea with loose BMs for several days. She reported abd pain with poor PO intake for about 24 hours prior to presentation. She also developed "gurgling" sounds while breathing, and had increased WOB. Her family was worried and brought to the ED.  SUBJECTIVE: No acute events since admission. Started on heparin drip for rising Troponin I. Patient denies any chest pain or pressure present. She denies any dyspnea. She is endorsing some mild nausea as well as abdominal discomfort.  REVIEW OF SYSTEMS:  Unable to obtain given dementia.  VITAL SIGNS: BP 113/83 mmHg  Pulse 85  Temp(Src) 99.2 F (37.3 C) (Oral)  Resp 38  Ht  (1.575 m)  Wt 148 lb 9.4 oz (67.4 kg)  BMI 27.17 kg/m2  SpO2 98%  HEMODYNAMICS:    VENTILATOR SETTINGS: Vent Mode:  [-] BIPAP FiO2 (%):  [40 %] 40 %  INTAKE / OUTPUT: I/O last 3 completed shifts: In: 444.8 [I.V.:94.8; IV Piggyback:350] Out: -   PHYSICAL EXAMINATION: General:  Cassandra Tanner. No distress. Awake. Husband at bedside. Neuro:  Grossly nonfocal. Somewhat confused as to location. Following commands. HEENT:  Tacky mucous membranes. No scleral injection or icterus. Cardiovascular:  Regular rate. Normal S1 & S2. No appreciated JVD. Lungs:  Clear bilaterally to auscultation. Normal breathing on nasal cannula oxygen. Abdomen:  Soft. Nondistended. Mildly tender to palpation diffusely. Integument: Dry. No rash on exposed skin.  LABS:  BMET  Recent  Labs Lab 03/11/16 1514 03/11/16 2214 03/12/16 0233  NA 140  --  140  K 3.5  --  3.7  CL 107  --  110  CO2 22  --  21*  BUN 17  --  17  CREATININE 0.76 0.81 0.75  GLUCOSE 164*  --  141*    Electrolytes  Recent Labs Lab 03/11/16 1514 03/11/16 2214 03/12/16 0233  CALCIUM 9.3  --  8.1*  MG  --  1.8 1.7  PHOS  --  3.5 4.0    CBC  Recent Labs Lab 03/11/16 1514 03/11/16 2214 03/12/16 0233  WBC 10.9* 13.7* 10.8*  HGB 15.4* 13.7 12.8  HCT 47.9* 43.2 40.3  PLT 161 134* 140*    Coag's No results for input(s): APTT, INR in the last 168 hours.  Sepsis Markers  Recent Labs Lab 03/11/16 1739 03/11/16 2056 03/11/16 2214 03/12/16 0233  LATICACIDVEN 4.65* 2.69*  --  1.3  PROCALCITON  --   --  1.66  --     ABG  Recent Labs Lab 03/11/16 2036  PHART 7.267*  PCO2ART 43.1  PO2ART 101.0*    Liver Enzymes  Recent Labs Lab 03/11/16 1514  AST 25  ALT 15  ALKPHOS 73  BILITOT 0.7  ALBUMIN 3.7    Cardiac Enzymes  Recent Labs Lab 03/11/16 2050 03/12/16 0233 03/12/16 1135  TROPONINI 2.33* 4.00* 3.79*    Glucose  Recent Labs Lab 03/11/16 2354 03/12/16 0830 03/12/16 1158  GLUCAP 149* 95 98    Imaging Dg Chest 2 View  03/11/2016  CLINICAL DATA:  possible sepsis EXAM: CHEST  2 VIEW COMPARISON:  None. FINDINGS: Lumbar spine fixation. Cervical spine fixation. Midline trachea. Borderline cardiomegaly. Atherosclerosis in the transverse aorta. No pleural effusion or pneumothorax. Mild biapical pleural thickening. Diffuse pulmonary interstitial thickening. Suspicion of more focal inferior right upper lobe patchy airspace disease. IMPRESSION: Possible posterior right upper lobe airspace disease. This is superimposed upon presumably chronic nonspecific interstitial thickening. Depending on clinical symptoms, consider radiographic follow-up in 2-4 days. Borderline cardiomegaly with aortic atherosclerosis. Electronically Signed   By: Jeronimo Greaves M.D.   On:  03/11/2016 15:54   Ct Angio Chest Pe W/cm &/or Wo Cm  03/11/2016  CLINICAL DATA:  Acute onset of congestion, cough, shortness of breath and fever. Initial encounter. EXAM: CT ANGIOGRAPHY CHEST WITH CONTRAST TECHNIQUE: Multidetector CT imaging of the chest was performed using the standard protocol during bolus administration of intravenous contrast. Multiplanar CT image reconstructions and MIPs were obtained to evaluate the vascular anatomy. CONTRAST:  100 mL of Isovue 300 IV contrast COMPARISON:  Chest radiograph performed earlier today at 5:22 p.m. FINDINGS: There is no evidence of pulmonary embolus. Minimal bilateral atelectasis is noted. The lungs are otherwise clear. A prominent bleb is noted at the left upper lobe. There is no evidence of significant focal consolidation, pleural effusion or pneumothorax. No masses are identified; no abnormal focal contrast enhancement is seen. Mild peribronchial thickening is noted, without a dominant mass. The mediastinum is grossly unremarkable in appearance. Visualized mediastinal nodes remain normal in size. Scattered calcification is seen along the aortic arch and descending thoracic aorta. The great vessels are grossly unremarkable in appearance. No axillary lymphadenopathy is seen. The visualized portions of the thyroid gland are unremarkable in appearance. The visualized portions of the liver and spleen are unremarkable. The patient is status post cholecystectomy, with clips noted at the gallbladder fossa. The visualized portions of the pancreas, adrenal glands and right kidney are grossly unremarkable. No acute osseous abnormalities are seen. Lumbar spinal fusion hardware is partially imaged, somewhat superiorly angulated. Multilevel vacuum phenomenon is noted along the lower thoracic spine. Review of the MIP images confirms the above findings. IMPRESSION: 1. No evidence of pulmonary embolus. 2. Minimal bilateral atelectasis noted. Lungs otherwise clear. Prominent  bleb at the left upper lobe. 3. Mild peribronchial thickening noted. 4. Scattered calcification along the aortic arch and descending thoracic aorta. 5. Degenerative change along the lower thoracic spine. Electronically Signed   By: Roanna Raider M.D.   On: 03/11/2016 19:04   Dg Chest Portable 1 View  03/11/2016  CLINICAL DATA:  Shortness of Breath EXAM: PORTABLE CHEST 1 VIEW COMPARISON:  03/11/2016 FINDINGS: Cardiac shadow is stable. The lungs are again well aerated. Increasing vascular congestion with new mild interstitial edema is noted. There is some improved aeration in the right upper lobe when compared with the prior exam. Postsurgical changes are again seen. IMPRESSION: Increased vascular congestion with interstitial edema. Electronically Signed   By: Alcide Clever M.D.   On: 03/11/2016 17:40    STUDIES:  CTA Chest 5/28: No PE, no infiltrate. Mild peribronchial thickening. TTE 5/29: EF 30-35%. Akinesis of anteroseptal & apical myocardium. Grade 1 diastolic dysfunction. Trivial AR. Moderate TR. Findings suggestive of takotsubo cardiomyopathy. RV systolic function normal. No pericardial effusion.  MICROBIOLOGY: Blood Ctx x2 5/28 >> Urine Ctx 5/28 >> MRSA PCR 5/28:  Negative C diff 5/29:  Negative  Stool PCR Pending  ANTIBIOTICS: Rocphin 5/28 x1 Azithromycin 5/28 >> Vanc PO 5/28 - 5/29  Vanc IV 5/28 >> Flagyl IV 5/28 >> Cefepime 5/28 >>  SIGNIFICANT EVENTS: 5/28 - Admit  LINES/TUBES: PIV x1  ASSESSMENT / PLAN:  PULMONARY A: Acute Hypoxic Respiratory Failure  P:   Wean FiO2 for Sat >92% OOB to chair  CARDIOVASCULAR A:  NSTEMI - Likely stress induced. No h/o cardiac disease. Takotsubo Cardiomyopathy  P:  Cariology following Trending Troponin I Vitals per unit protocol Monitor on telemetry ASA 165mg  PO daily Lopressor 2.5mg  IV q6hr Heparin gtt per protocol  RENAL A:   Metabolic acidosis - Mild.  P:   Trending renal function & electrolytes daily Monitoring  UOP  GASTROINTESTINAL A:   Possible Infectious Diarrhea  P:   Clear liquid diet as tolerated Zofran prn  HEMATOLOGIC A:   Thrombocytopenia - Mild.  P:  Heparin gtt per protocol Trending cell counts daily w/ CBC  INFECTIOUS A:   Severe Sepsis - Likely GI Source.  P:   Empiric Cefepime, PO Vanc, IV Flagyl, & Azithromycin PCT per algorithm Awaiting culture results Respiratory Panel PCR  ENDOCRINE A:   Hyperglycemia - No h/o DM.  P:   Checking Hgb A1c SSI per Sensitive Algorithm Accu-Checks q4hr until taking consistent orals  NEUROLOGIC A:   H/O Dementia  P:   Continue to monitor Restarting home Aricept, Lexapro, & Neurontin.  FAMILY  - Updates: Updated husband and son called this morning medicine should be at bedside 5/29.  - Inter-disciplinary family meet or Palliative Care meeting due by:  June 5   TODAY'S SUMMARY:  82 y/o woman with sepsis and findings on echocardiogram consistent with stress-induced cardiomyopathy. Suspect viral etiology given bronchial wall thickening on CT scan. Discontinuing oral and IV vancomycin. Awaiting stool panel PCR. Restarting patient's home medications today and ordering clear liquid diet as tolerated.given patient's continued clinical stability we will transition her to a stepdown unit bed. Hospitalist service to assume care & PCCM to sign off as of 5/30. Family may wish to transition to a hospital in [place] depending upon length of stay.  Donna ChristenJennings E. Jamison NeighborNestor, M.D. [facility] & [facility] Pager:  [phone] After 3pm or if no response, call [phone] 12:47 PM 03/12/2016

## 2016-03-12 NOTE — Consult Note (Signed)
CARDIOLOGY CONSULT NOTE   Patient ID: Willette Bracehyllis Tyminski MRN: [id number] DOB/AGE: 05/20/1930 82 y.o.  Admit date: 03/11/2016  Primary Physician   No primary care provider on file. Primary Cardiologist   None  Reason for Consultation   Flash pulm edema and elevated troponin Requesting MD: Dr Eugenia Pancoastrimble  FAO:[id number]:Johnica Seib is a 82 y.o. year old female with a history of dementia and Parkinson's dz, visiting here from [place] when she developed increasing SOB. In the ER, she was in pulmonary edema and had an elevated troponin, an elevated lactate level, as well as a recent history of diarrhea. Cards asked to see for the edema and elevated troponin   Pt says she has no cardiac history, brother has heart trouble, no further details available. Quit tobacco many years ago, does not drink. She never gets chest pain.   She does not remember being SOB, is on O2 and says she is breathing ok. However, she gets SOB with conversation and clearly has orthopnea by observation. She is currently pain-free and not SOB at rest, on O2.   Past Medical History  Diagnosis Date  . Parkinson's disease (HCC)   . Dementia      Past Surgical History  Procedure Laterality Date  . Back surgery    . Hip surgery    . Knee surgery      No Known Allergies  I have reviewed the patient's current medications . aspirin  150 mg Rectal Daily  . azithromycin  500 mg Intravenous Q24H  . ceFEPime (MAXIPIME) IV  2 g Intravenous Q12H  . insulin aspart  0-15 Units Subcutaneous TID WC  . insulin aspart  0-5 Units Subcutaneous QHS  . metoprolol  2.5 mg Intravenous Q6H  . metronidazole  500 mg Intravenous Q8H  . vancomycin  125 mg Oral Q6H  . vancomycin  500 mg Intravenous Q12H   . heparin 750 Units/hr (03/12/16 0600)   sodium chloride, diphenhydrAMINE  Prior to Admission medications   Medication Sig Start Date End Date Taking? Authorizing Provider  Cholecalciferol (VITAMIN D-3) 1000 units CAPS Take 1,000  Units by mouth every morning.   Yes Historical Provider, MD  donepezil (ARICEPT) 5 MG tablet Take 5 mg by mouth at bedtime.   Yes Historical Provider, MD  escitalopram (LEXAPRO) 10 MG tablet Take 10 mg by mouth at bedtime.   Yes Historical Provider, MD  gabapentin (NEURONTIN) 300 MG capsule Take 300 mg by mouth at bedtime.   Yes Historical Provider, MD  vitamin C (ASCORBIC ACID) 500 MG tablet Take 500 mg by mouth every morning.   Yes Historical Provider, MD     Social History   Social History  . Marital Status: Married    Spouse Name: N/A  . Number of Children: N/A  . Years of Education: N/A   Occupational History  . Retired    Social History Main Topics  . Smoking status: Former Smoker    Quit date: 10/15/1978  . Smokeless tobacco: Never Used  . Alcohol Use: No  . Drug Use: No  . Sexual Activity: Not on file   Other Topics Concern  . Not on file   Social History Narrative   L[facility] in [facility] in [place], [place] with her husband.    Family Status  Relation Status Death Age  . Mother Deceased   . Father Deceased    Family History  Problem Relation Age of Onset  . Coronary artery disease Brother  ROS:  Full 14 point review of systems complete and found to be negative unless listed above.  Physical Exam: Blood pressure 102/64, pulse 75, temperature 99.2 F (37.3 C), temperature source Oral, resp. rate 22, height 5\' 2"  (1.575 m), weight 148 lb 9.4 oz (67.4 kg), SpO2 99 %.  General: Well developed, well nourished, female in no acute distress Head: Eyes PERRLA, No xanthomas.   Normocephalic and atraumatic, oropharynx without edema or exudate. Dentition: poor Lungs: rales bases Heart: HRRR S1 S2, no rub/gallop, no sig murmur. Heart sounds hard to hear due to resp noise. pulses are 2+ all 4 extrem.   Neck: No carotid bruits. No lymphadenopathy.  JVD elevated 10 cm Abdomen: Bowel sounds present, abdomen soft and non-tender without masses or hernias  noted. Msk:  No spine or cva tenderness. No weakness, no joint deformities or effusions. Extremities: No clubbing or cyanosis. No edema.  Neuro: Alert and oriented X 2. No focal deficits noted. Psych:  Good affect, responds appropriately Skin: No rashes or lesions noted.  Labs:   Lab Results  Component Value Date   WBC 10.8* 03/12/2016   HGB 12.8 03/12/2016   HCT 40.3 03/12/2016   MCV 90.2 03/12/2016   PLT 140* 03/12/2016    Recent Labs Lab 03/11/16 1514  03/12/16 0233  NA 140  --  140  K 3.5  --  3.7  CL 107  --  110  CO2 22  --  21*  BUN 17  --  17  CREATININE 0.76  < > 0.75  CALCIUM 9.3  --  8.1*  PROT 6.9  --   --   BILITOT 0.7  --   --   ALKPHOS 73  --   --   ALT 15  --   --   AST 25  --   --   GLUCOSE 164*  --  141*  ALBUMIN 3.7  --   --   < > = values in this interval not displayed. MAGNESIUM  Date Value Ref Range Status  03/12/2016 1.7 1.7 - 2.4 mg/dL Final    Recent Labs  40/98/11 1723 03/11/16 2050 03/12/16 0233  TROPONINI 0.08* 2.33* 4.00*    Recent Labs  03/11/16 1732  TROPIPOC 0.85*   B NATRIURETIC PEPTIDE  Date/Time Value Ref Range Status  03/11/2016 05:22 PM 107.7* 0.0 - 100.0 pg/mL Final   LIPASE  Date/Time Value Ref Range Status  03/11/2016 10:14 PM 26 11 - 51 U/L Final   Echo: ordered  ECG:  05/28 SR w/ ?LBBB and PVCs  Radiology:  Dg Chest 2 View 03/11/2016  CLINICAL DATA:  possible sepsis EXAM: CHEST  2 VIEW COMPARISON:  None. FINDINGS: Lumbar spine fixation. Cervical spine fixation. Midline trachea. Borderline cardiomegaly. Atherosclerosis in the transverse aorta. No pleural effusion or pneumothorax. Mild biapical pleural thickening. Diffuse pulmonary interstitial thickening. Suspicion of more focal inferior right upper lobe patchy airspace disease. IMPRESSION: Possible posterior right upper lobe airspace disease. This is superimposed upon presumably chronic nonspecific interstitial thickening. Depending on clinical symptoms,  consider radiographic follow-up in 2-4 days. Borderline cardiomegaly with aortic atherosclerosis. Electronically Signed   By: Jeronimo Greaves M.D.   On: 03/11/2016 15:54   Ct Angio Chest Pe W/cm &/or Wo Cm 03/11/2016  CLINICAL DATA:  Acute onset of congestion, cough, shortness of breath and fever. Initial encounter. EXAM: CT ANGIOGRAPHY CHEST WITH CONTRAST TECHNIQUE: Multidetector CT imaging of the chest was performed using the standard protocol during bolus administration of intravenous contrast.  Multiplanar CT image reconstructions and MIPs were obtained to evaluate the vascular anatomy. CONTRAST:  100 mL of Isovue 300 IV contrast COMPARISON:  Chest radiograph performed earlier today at 5:22 p.m. FINDINGS: There is no evidence of pulmonary embolus. Minimal bilateral atelectasis is noted. The lungs are otherwise clear. A prominent bleb is noted at the left upper lobe. There is no evidence of significant focal consolidation, pleural effusion or pneumothorax. No masses are identified; no abnormal focal contrast enhancement is seen. Mild peribronchial thickening is noted, without a dominant mass. The mediastinum is grossly unremarkable in appearance. Visualized mediastinal nodes remain normal in size. Scattered calcification is seen along the aortic arch and descending thoracic aorta. The great vessels are grossly unremarkable in appearance. No axillary lymphadenopathy is seen. The visualized portions of the thyroid gland are unremarkable in appearance. The visualized portions of the liver and spleen are unremarkable. The patient is status post cholecystectomy, with clips noted at the gallbladder fossa. The visualized portions of the pancreas, adrenal glands and right kidney are grossly unremarkable. No acute osseous abnormalities are seen. Lumbar spinal fusion hardware is partially imaged, somewhat superiorly angulated. Multilevel vacuum phenomenon is noted along the lower thoracic spine. Review of the MIP images  confirms the above findings. IMPRESSION: 1. No evidence of pulmonary embolus. 2. Minimal bilateral atelectasis noted. Lungs otherwise clear. Prominent bleb at the left upper lobe. 3. Mild peribronchial thickening noted. 4. Scattered calcification along the aortic arch and descending thoracic aorta. 5. Degenerative change along the lower thoracic spine. Electronically Signed   By: Roanna Raider M.D.   On: 03/11/2016 19:04   Dg Chest Portable 1 View 03/11/2016  CLINICAL DATA:  Shortness of Breath EXAM: PORTABLE CHEST 1 VIEW COMPARISON:  03/11/2016 FINDINGS: Cardiac shadow is stable. The lungs are again well aerated. Increasing vascular congestion with new mild interstitial edema is noted. There is some improved aeration in the right upper lobe when compared with the prior exam. Postsurgical changes are again seen. IMPRESSION: Increased vascular congestion with interstitial edema. Electronically Signed   By: Alcide Clever M.D.   On: 03/11/2016 17:40    ASSESSMENT AND PLAN:   The patient was seen today by Dr Jens Som, the patient evaluated and the data reviewed.  Principal Problem: 1.  Sepsis (HCC) - per CCM - no longer febrile, WBC trending down  Active Problems: 2.  Acute pulmonary edema (HCC) - echo is ordered - sx improved with Lasix 20 mg x 1 dose. - no edema on chest CT but has JVD, further diuresis per MD  3.  Elevated troponin - echo ordered, not done yet, f/u on results - no mention of coronary calcifications on CT - still trending up, continue to follow. - she has had ASA, is on heparin and IV Lopressor - review echo results with family to decide next step   Signed: Leanna Battles 03/12/2016 7:58 AM Beeper [phone]  Co-Sign MD As above, patient seen and examine. Briefly she is an 82 year old female with past medical history of Parkinson's and dementia for evaluation of non-ST elevation myocardial infarction. She is from [place] visiting this area. Patient states that she  presented because of nausea and abdominal pain. She denies dyspnea to me or chest pain. She was found to have elevated troponin and cardiology asked to evaluate. Note she continues to deny chest pain. Shows sinus rhythm with PVC sand prior septal infarct cannot be excluded.  Patient with non-ST elevation myocardial infarction with troponin 4.0.  Continue aspirin,  heparin and metoprolol. Echocardiogram to assess LV function. Continue therapy for abdominal pain and diarrhea. If LV function normal will likely pursue nuclear study for risk stratification as she has not had chest pain. Question if troponin elevation is demand ischemia. If LV function abnormal will likely require cardiac catheterization. Olga Millers

## 2016-03-12 NOTE — Progress Notes (Signed)
E-link notified about elevated troponin of 4.0. Patient denies chest pain and no shortness of breath. No signs or symptoms of acute distress. No new orders given at this time. Will continue to closely monitor

## 2016-03-12 NOTE — Progress Notes (Signed)
Pt complains of itching on her right arm and her head post vancomycin infusion. E-link notified and orders received for benadryl. Will continue to closely monitor.

## 2016-03-13 ENCOUNTER — Inpatient Hospital Stay (HOSPITAL_COMMUNITY): Payer: Medicare (Managed Care)

## 2016-03-13 DIAGNOSIS — E876 Hypokalemia: Secondary | ICD-10-CM | POA: Diagnosis present

## 2016-03-13 DIAGNOSIS — A419 Sepsis, unspecified organism: Secondary | ICD-10-CM | POA: Diagnosis present

## 2016-03-13 DIAGNOSIS — R6520 Severe sepsis without septic shock: Secondary | ICD-10-CM

## 2016-03-13 DIAGNOSIS — J189 Pneumonia, unspecified organism: Secondary | ICD-10-CM

## 2016-03-13 DIAGNOSIS — J9601 Acute respiratory failure with hypoxia: Secondary | ICD-10-CM | POA: Diagnosis present

## 2016-03-13 DIAGNOSIS — F0390 Unspecified dementia without behavioral disturbance: Secondary | ICD-10-CM | POA: Diagnosis present

## 2016-03-13 DIAGNOSIS — R0989 Other specified symptoms and signs involving the circulatory and respiratory systems: Secondary | ICD-10-CM

## 2016-03-13 DIAGNOSIS — D696 Thrombocytopenia, unspecified: Secondary | ICD-10-CM | POA: Diagnosis present

## 2016-03-13 LAB — RENAL FUNCTION PANEL
ANION GAP: 7 (ref 5–15)
Albumin: 2.8 g/dL — ABNORMAL LOW (ref 3.5–5.0)
BUN: 15 mg/dL (ref 6–20)
CHLORIDE: 109 mmol/L (ref 101–111)
CO2: 25 mmol/L (ref 22–32)
Calcium: 8.4 mg/dL — ABNORMAL LOW (ref 8.9–10.3)
Creatinine, Ser: 0.76 mg/dL (ref 0.44–1.00)
GFR calc non Af Amer: >60 mL/min (ref >60)
Glucose, Bld: 86 mg/dL (ref 65–99)
POTASSIUM: 3.3 mmol/L — AB (ref 3.5–5.1)
Phosphorus: 2.7 mg/dL (ref 2.5–4.6)
Sodium: 141 mmol/L (ref 135–145)

## 2016-03-13 LAB — GASTROINTESTINAL PANEL BY PCR, STOOL (REPLACES STOOL CULTURE)
ADENOVIRUS F40/41: NOT DETECTED
Astrovirus: NOT DETECTED
CAMPYLOBACTER SPECIES: NOT DETECTED
Cryptosporidium: NOT DETECTED
Cyclospora cayetanensis: NOT DETECTED
E. coli O157: NOT DETECTED
ENTEROTOXIGENIC E COLI (ETEC): NOT DETECTED
Entamoeba histolytica: NOT DETECTED
Enteroaggregative E coli (EAEC): NOT DETECTED
Enteropathogenic E coli (EPEC): NOT DETECTED
GIARDIA LAMBLIA: NOT DETECTED
NOROVIRUS GI/GII: NOT DETECTED
PLESIMONAS SHIGELLOIDES: NOT DETECTED
Rotavirus A: NOT DETECTED
Salmonella species: NOT DETECTED
Sapovirus (I, II, IV, and V): NOT DETECTED
Shiga like toxin producing E coli (STEC): NOT DETECTED
Shigella/Enteroinvasive E coli (EIEC): NOT DETECTED
VIBRIO CHOLERAE: NOT DETECTED
Vibrio species: NOT DETECTED
Yersinia enterocolitica: NOT DETECTED

## 2016-03-13 LAB — CBC WITH DIFFERENTIAL/PLATELET
BASOS PCT: 1 %
Basophils Absolute: 0.1 10*3/uL (ref 0.0–0.1)
Eosinophils Absolute: 0.1 10*3/uL (ref 0.0–0.7)
Eosinophils Relative: 1 %
HEMATOCRIT: 37.9 % (ref 36.0–46.0)
HEMOGLOBIN: 12 g/dL (ref 12.0–15.0)
LYMPHS ABS: 2.3 10*3/uL (ref 0.7–4.0)
LYMPHS PCT: 29 %
MCH: 28.9 pg (ref 26.0–34.0)
MCHC: 31.7 g/dL (ref 30.0–36.0)
MCV: 91.3 fL (ref 78.0–100.0)
MONOS PCT: 10 %
Monocytes Absolute: 0.8 10*3/uL (ref 0.1–1.0)
NEUTROS ABS: 4.5 10*3/uL (ref 1.7–7.7)
Neutrophils Relative %: 59 %
Platelets: 128 10*3/uL — ABNORMAL LOW (ref 150–400)
RBC: 4.15 MIL/uL (ref 3.87–5.11)
RDW: 15.5 % (ref 11.5–15.5)
WBC: 7.8 10*3/uL (ref 4.0–10.5)

## 2016-03-13 LAB — HEPARIN LEVEL (UNFRACTIONATED): Heparin Unfractionated: 0.36 IU/mL (ref 0.30–0.70)

## 2016-03-13 LAB — POCT I-STAT 3, ART BLOOD GAS (G3+)
Acid-base deficit: 7 mmol/L — ABNORMAL HIGH (ref 0.0–2.0)
Bicarbonate: 20.1 mEq/L (ref 20.0–24.0)
O2 SAT: 96 %
PCO2 ART: 43.4 mmHg (ref 35.0–45.0)
PH ART: 7.274 — AB (ref 7.350–7.450)
PO2 ART: 92 mmHg (ref 80.0–100.0)
TCO2: 21 mmol/L (ref 0–100)

## 2016-03-13 LAB — GLUCOSE, CAPILLARY
GLUCOSE-CAPILLARY: 82 mg/dL (ref 65–99)
GLUCOSE-CAPILLARY: 81 mg/dL (ref 65–99)
GLUCOSE-CAPILLARY: 94 mg/dL (ref 65–99)
GLUCOSE-CAPILLARY: 129 mg/dL — AB (ref 65–99)
GLUCOSE-CAPILLARY: 195 mg/dL — AB (ref 65–99)
Glucose-Capillary: 98 mg/dL (ref 65–99)

## 2016-03-13 LAB — PROCALCITONIN: Procalcitonin: 1.48 ng/mL

## 2016-03-13 LAB — MAGNESIUM: Magnesium: 1.9 mg/dL (ref 1.7–2.4)

## 2016-03-13 LAB — HEMOGLOBIN A1C
Hgb A1c MFr Bld: 5.9 % — ABNORMAL HIGH (ref 4.8–5.6)
MEAN PLASMA GLUCOSE: 123 mg/dL

## 2016-03-13 MED ORDER — FUROSEMIDE 10 MG/ML IJ SOLN
40.0000 mg | Freq: Two times a day (BID) | INTRAMUSCULAR | Status: DC
  Administered 2016-03-13 – 2016-03-14 (×3): 40 mg via INTRAVENOUS
  Filled 2016-03-13 (×4): qty 4

## 2016-03-13 MED ORDER — CARVEDILOL 3.125 MG PO TABS
3.1250 mg | ORAL_TABLET | Freq: Two times a day (BID) | ORAL | Status: DC
  Administered 2016-03-13: 3.125 mg via ORAL
  Filled 2016-03-13 (×2): qty 1

## 2016-03-13 MED ORDER — METHYLPREDNISOLONE SODIUM SUCC 125 MG IJ SOLR
60.0000 mg | INTRAMUSCULAR | Status: DC
  Administered 2016-03-13: 60 mg via INTRAVENOUS
  Filled 2016-03-13: qty 2

## 2016-03-13 MED ORDER — DEXTROSE 5 % IV SOLN
2.0000 g | INTRAVENOUS | Status: DC
  Administered 2016-03-14: 2 g via INTRAVENOUS
  Filled 2016-03-13: qty 2

## 2016-03-13 MED ORDER — POTASSIUM CHLORIDE 10 MEQ/100ML IV SOLN
10.0000 meq | INTRAVENOUS | Status: AC
  Administered 2016-03-13 (×4): 10 meq via INTRAVENOUS
  Filled 2016-03-13 (×4): qty 100

## 2016-03-13 MED ORDER — CARVEDILOL 3.125 MG PO TABS
3.1250 mg | ORAL_TABLET | Freq: Two times a day (BID) | ORAL | Status: DC
  Administered 2016-03-13 – 2016-03-15 (×5): 3.125 mg via ORAL
  Filled 2016-03-13 (×7): qty 1

## 2016-03-13 MED ORDER — LEVALBUTEROL HCL 1.25 MG/0.5ML IN NEBU
1.2500 mg | INHALATION_SOLUTION | Freq: Four times a day (QID) | RESPIRATORY_TRACT | Status: DC
  Administered 2016-03-13 (×2): 1.25 mg via RESPIRATORY_TRACT
  Filled 2016-03-13 (×4): qty 0.5

## 2016-03-13 MED ORDER — MAGNESIUM SULFATE 2 GM/50ML IV SOLN
2.0000 g | Freq: Once | INTRAVENOUS | Status: AC
  Administered 2016-03-13: 2 g via INTRAVENOUS
  Filled 2016-03-13: qty 50

## 2016-03-13 MED ORDER — METOPROLOL TARTRATE 12.5 MG HALF TABLET: 12.5000 mg | ORAL_TABLET | Freq: Two times a day (BID) | ORAL | Status: DC

## 2016-03-13 MED ORDER — IOPAMIDOL (ISOVUE-300) INJECTION 61%
100.0000 mL | Freq: Once | INTRAVENOUS | Status: AC | PRN
  Administered 2016-03-11: 100 mL via INTRAVENOUS

## 2016-03-13 MED ORDER — CARVEDILOL 6.25 MG PO TABS
6.2500 mg | ORAL_TABLET | Freq: Two times a day (BID) | ORAL | Status: DC
  Filled 2016-03-13: qty 1

## 2016-03-13 NOTE — Progress Notes (Signed)
PROGRESS NOTE    Cassandra Tanner  WJX:[id number] DOB: 1930/09/30 DOA: 03/11/2016 PCP: No primary care provider on file.   Brief Narrative:  Cassandra Tanner is an 82 y/o WF PMHx Unk? From [place] who was visiting [facility] for her grandson's wedding when she developed worsening dyspnea, fevers, altered mental status, and abdominal pain. Her husband reports she was in her usual state of health with no recent changes until about 48 hours prior to presentation when she developed diarrhea with loose BMs for several days. She reported abd pain with poor PO intake for about 24 hours prior to presentation. She also developed "gurgling" sounds while breathing, and had increased WOB. Her family was worried and brought to the ED.   Assessment & Plan:   Principal Problem:   Sepsis (HCC) Active Problems:   Acute pulmonary edema (HCC)   Elevated troponin   NSTEMI (non-ST elevated myocardial infarction) (HCC)   Severe sepsis (HCC)   Sepsis due to pneumonia (HCC)   Acute respiratory failure with hypoxia (HCC)   Pulmonary vascular congestion   Thrombocytopenia (HCC)   Dementia   Hypokalemia   Hypomagnesemia   Severe Sepsis -Most likely secondary to virus but may also be component of bacterial superinfection atypical present. -Treat symptomatically but complete 5 day course of azithromycin  Acute Hypoxic Respiratory Failure/mild respiratory acidosis - Xopenex QID -Mucinex DM BID -Flutter valve -Solu-Medrol 60 mg daily -Sputum pending -Titrate O2 to maintain SPO2>93%  -DC all other antibiotics, continue Azithromycin for 5 day course  NSTEMI - Likely stress induced. No h/o cardiac disease. Takotsubo Cardiomyopathy -Strict in and out since admission +2.2 L -Daily weight Filed Weights   03/12/16 0000 03/12/16 0409 03/13/16 0410  Weight: 67.4 kg (148 lb 9.4 oz) 67.4 kg (148 lb 9.4 oz) 68.5 kg (151 lb 0.2 oz)  -Coreg 3.125 mg BID -Start lisinopril in A.m. if patient's BP tolerates -ASA 165mg  PO  daily -Heparin gtt per protocol  Pulmonary vascular congestion -Lasix 40 mg BID  GI bleed? -RN Boyd Kerbs noticed possible blood in stool. -Obtain occult blood, if positive may need to discontinue heparin  Possible Infectious Diarrhea -C. difficile/stool PCR negative -DC metronidazole and vancomycin  Thrombocytopenia - Mild. -Trending down; HIT vs viral pneumonia -See GI bleed?  Hyperglycemia  - No h/o DM. -Checking Hgb A1c -SSI per Sensitive Algorithm  Dementia -Aricept 5 mg QHS -Lexapro 10 mg QHS -Neurontin 300 mg QHS  Hypokalemia -Potassium goal> 4 -Potassium IV 40 mEq  Hypomagnesemia -Magnesium goal>2 -Magnesium IV 2 gm  Goals of care -Mr.Cafiero and his son would like to discuss possibility of transferring patient to [facility] in [place].  They understand that since this would be an elective transfer may incur a cost, and would like to discuss.   DVT prophylaxis: Heparin drip  Code Status: Full Family Communication: Mr.Erichsen and his son Disposition Plan: Possible transfer to [facility] in [place].   Consultants:  [facility]  Procedures/Significant Events:  CTA Chest 5/28: No PE, no infiltrate. Mild peribronchial thickening. 5/29 TTE: EF 30-35%. Akinesis of anteroseptal & apical myocardium. Grade 1 diastolic dysfunction. Trivial AR. Moderate TR. Findings suggestive of takotsubo cardiomyopathy. RV systolic function normal. No pericardial effusion.  Cultures 5/28 blood left  AC/right hand NGTD  5/28 urine negative final  5/28 MRSA by PCR negative 5/29 respiratory virus panel positive parainfluenza 3  5/29 C. difficile negative 5/29 Stool PCR negative  5/30 sputum pending   Antimicrobials: Rocphin 5/28 x1 Azithromycin 5/28 >> Vanc PO 5/28 -  5/29 Vanc IV 5/28 >> 5/29 Flagyl IV 5/28 >> 5/30 Cefepime 5/28 >> 5/30   Devices    LINES / TUBES:      Continuous Infusions: . heparin 750 Units/hr (03/13/16 0800)     Subjective: 5/30 alert,  confused. Believes she flew into [place] 4 grandsons wedding in [place] (patient flew into [place] and came to [place] for grandsons wedding). Unsure of how long she has been married (states 40 years but has been 60 years). States smoked years ago but not recently   Objective: Filed Vitals:   03/13/16 1500 03/13/16 1600 03/13/16 1618 03/13/16 1653  BP: 163/137 127/87    Pulse: 95 96    Temp:   98.3 F (36.8 C)   TempSrc:   Oral   Resp: 28 27    Height:      Weight:      SpO2: 97% 98%  98%    Intake/Output Summary (Last 24 hours) at 03/13/16 1715 Last data filed at 03/13/16 1600  Gross per 24 hour  Intake 1377.5 ml  Output      0 ml  Net 1377.5 ml   Filed Weights   03/12/16 0000 03/12/16 0409 03/13/16 0410  Weight: 67.4 kg (148 lb 9.4 oz) 67.4 kg (148 lb 9.4 oz) 68.5 kg (151 lb 0.2 oz)    Examination:  General: alert, confused, positive acute respiratory distress Eyes: negative scleral hemorrhage, negative anisocoria, negative icterus ENT: Negative Runny nose, negative gingival bleeding, Neck:  Negative scars, masses, torticollis, lymphadenopathy, JVD Lungs: diffuse bilateral rhonchi and expiratory wheezing  Cardiovascular: Regular rate and rhythm without murmur gallop or rub normal S1 and S2 Abdomen: negative abdominal pain, nondistended, positive soft, bowel sounds, no rebound, no ascites, no appreciable mass Extremities: No significant cyanosis, clubbing, or edema bilateral lower extremities Skin: Negative rashes, lesions, ulcers Psychiatric:  Negative depression, negative anxiety, negative fatigue, negative mania  Central nervous system:  Cranial nerves II through XII intact, tongue/uvula midline, all extremities muscle strength 5/5, sensation intact throughout, negative dysarthria, negative expressive aphasia, negative receptive aphasia.  .     Data Reviewed: Care during the described time interval was provided by me .  I have reviewed this patient's available  data, including medical history, events of note, physical examination, and all test results as part of my evaluation. I have personally reviewed and interpreted all radiology studies.  CBC:  Recent Labs Lab 03/11/16 1514 03/11/16 2214 03/12/16 0233 03/13/16 0642  WBC 10.9* 13.7* 10.8* 7.8  NEUTROABS 9.0*  --   --  4.5  HGB 15.4* 13.7 12.8 12.0  HCT 47.9* 43.2 40.3 37.9  MCV 91.1 90.2 90.2 91.3  PLT 161 134* 140* 128*   Basic Metabolic Panel:  Recent Labs Lab 03/11/16 1514 03/11/16 2214 03/12/16 0233 03/13/16 0642  NA 140  --  140 141  K 3.5  --  3.7 3.3*  CL 107  --  110 109  CO2 22  --  21* 25  GLUCOSE 164*  --  141* 86  BUN 17  --  17 15  CREATININE 0.76 0.81 0.75 0.76  CALCIUM 9.3  --  8.1* 8.4*  MG  --  1.8 1.7 1.9  PHOS  --  3.5 4.0 2.7   GFR: Estimated Creatinine Clearance: 48.4 mL/min (by C-G formula based on Cr of 0.76). Liver Function Tests:  Recent Labs Lab 03/11/16 1514 03/13/16 0642  AST 25  --   ALT 15  --   ALKPHOS 73  --  BILITOT 0.7  --   PROT 6.9  --   ALBUMIN 3.7 2.8*    Recent Labs Lab 03/11/16 2214  LIPASE 26   No results for input(s): AMMONIA in the last 168 hours. Coagulation Profile: No results for input(s): INR, PROTIME in the last 168 hours. Cardiac Enzymes:  Recent Labs Lab 03/11/16 1723 03/11/16 2050 03/12/16 0233 03/12/16 1135 03/12/16 1755  TROPONINI 0.08* 2.33* 4.00* 3.79* 3.12*   BNP (last 3 results) No results for input(s): PROBNP in the last 8760 hours. HbA1C:  Recent Labs  03/12/16 1507  HGBA1C 5.9*   CBG:  Recent Labs Lab 03/13/16 0023 03/13/16 0430 03/13/16 0819 03/13/16 1135 03/13/16 1540  GLUCAP 98 82 81 94 129*   Lipid Profile: No results for input(s): CHOL, HDL, LDLCALC, TRIG, CHOLHDL, LDLDIRECT in the last 72 hours. Thyroid Function Tests: No results for input(s): TSH, T4TOTAL, FREET4, T3FREE, THYROIDAB in the last 72 hours. Anemia Panel: No results for input(s): VITAMINB12,  FOLATE, FERRITIN, TIBC, IRON, RETICCTPCT in the last 72 hours. Urine analysis:    Component Value Date/Time   COLORURINE YELLOW 03/11/2016 1632   APPEARANCEUR CLOUDY* 03/11/2016 1632   LABSPEC 1.028 03/11/2016 1632   PHURINE 5.5 03/11/2016 1632   GLUCOSEU NEGATIVE 03/11/2016 1632   HGBUR SMALL* 03/11/2016 1632   BILIRUBINUR NEGATIVE 03/11/2016 1632   KETONESUR 15* 03/11/2016 1632   PROTEINUR >300* 03/11/2016 1632   NITRITE NEGATIVE 03/11/2016 1632   LEUKOCYTESUR NEGATIVE 03/11/2016 1632   Sepsis Labs: @LABRCNTIP (procalcitonin:4,lacticidven:4)  ) Recent Results (from the past 240 hour(s))  Culture, blood (Routine x 2)     Status: None (Preliminary result)   Collection Time: 03/11/16  3:14 PM  Result Value Ref Range Status   Specimen Description BLOOD LEFT ANTECUBITAL  Final   Special Requests BOTTLES DRAWN AEROBIC AND ANAEROBIC 5 CC  Final   Culture NO GROWTH 2 DAYS  Final   Report Status PENDING  Incomplete  Culture, blood (Routine x 2)     Status: None (Preliminary result)   Collection Time: 03/11/16  4:13 PM  Result Value Ref Range Status   Specimen Description BLOOD RIGHT HAND  Final   Special Requests IN PEDIATRIC BOTTLE 4 ML  Final   Culture NO GROWTH 2 DAYS  Final   Report Status PENDING  Incomplete  Urine culture     Status: None   Collection Time: 03/11/16  4:32 PM  Result Value Ref Range Status   Specimen Description URINE, CATHETERIZED  Final   Special Requests NONE  Final   Culture NO GROWTH  Final   Report Status 03/12/2016 FINAL  Final  MRSA PCR Screening     Status: None   Collection Time: 03/11/16 11:47 PM  Result Value Ref Range Status   MRSA by PCR NEGATIVE NEGATIVE Final    Comment:        The GeneXpert MRSA Assay (FDA approved for NASAL specimens only), is one component of a comprehensive MRSA colonization surveillance program. It is not intended to diagnose MRSA infection nor to guide or monitor treatment for MRSA infections.   C difficile  quick scan w PCR reflex     Status: None   Collection Time: 03/12/16 10:12 AM  Result Value Ref Range Status   C Diff antigen NEGATIVE NEGATIVE Final   C Diff toxin NEGATIVE NEGATIVE Final   C Diff interpretation Negative for toxigenic C. difficile  Final  Respiratory Panel by PCR     Status: Abnormal   Collection Time:  03/12/16  2:19 PM  Result Value Ref Range Status   Adenovirus NOT DETECTED NOT DETECTED Final   Coronavirus 229E NOT DETECTED NOT DETECTED Final   Coronavirus HKU1 NOT DETECTED NOT DETECTED Final   Coronavirus NL63 NOT DETECTED NOT DETECTED Final   Coronavirus OC43 NOT DETECTED NOT DETECTED Final   Metapneumovirus NOT DETECTED NOT DETECTED Final   Rhinovirus / Enterovirus NOT DETECTED NOT DETECTED Final   Influenza A NOT DETECTED NOT DETECTED Final   Influenza A H1 NOT DETECTED NOT DETECTED Final   Influenza A H1 2009 NOT DETECTED NOT DETECTED Final   Influenza A H3 NOT DETECTED NOT DETECTED Final   Influenza B NOT DETECTED NOT DETECTED Final   Parainfluenza Virus 1 NOT DETECTED NOT DETECTED Corrected    Comment: CORRECTED ON 05/29 AT 1756: PREVIOUSLY REPORTED AS DETECTED   Parainfluenza Virus 2 NOT DETECTED NOT DETECTED Final   Parainfluenza Virus 3 DETECTED (A) NOT DETECTED Corrected    Comment: CORRECTED ON 05/29 AT 1756: PREVIOUSLY REPORTED AS NOT DETECTED   Parainfluenza Virus 4 NOT DETECTED NOT DETECTED Final   Respiratory Syncytial Virus NOT DETECTED NOT DETECTED Final   Bordetella pertussis NOT DETECTED NOT DETECTED Final   Chlamydophila pneumoniae NOT DETECTED NOT DETECTED Final   Mycoplasma pneumoniae NOT DETECTED NOT DETECTED Final  Gastrointestinal Panel by PCR , Stool     Status: None   Collection Time: 03/12/16  6:36 PM  Result Value Ref Range Status   Campylobacter species NOT DETECTED NOT DETECTED Final   Plesimonas shigelloides NOT DETECTED NOT DETECTED Final   Salmonella species NOT DETECTED NOT DETECTED Final   Yersinia enterocolitica NOT  DETECTED NOT DETECTED Final   Vibrio species NOT DETECTED NOT DETECTED Final   Vibrio cholerae NOT DETECTED NOT DETECTED Final   Enteroaggregative E coli (EAEC) NOT DETECTED NOT DETECTED Final   Enteropathogenic E coli (EPEC) NOT DETECTED NOT DETECTED Final   Enterotoxigenic E coli (ETEC) NOT DETECTED NOT DETECTED Final   Shiga like toxin producing E coli (STEC) NOT DETECTED NOT DETECTED Final   E. coli O157 NOT DETECTED NOT DETECTED Final   Shigella/Enteroinvasive E coli (EIEC) NOT DETECTED NOT DETECTED Final   Cryptosporidium NOT DETECTED NOT DETECTED Final   Cyclospora cayetanensis NOT DETECTED NOT DETECTED Final   Entamoeba histolytica NOT DETECTED NOT DETECTED Final   Giardia lamblia NOT DETECTED NOT DETECTED Final   Adenovirus F40/41 NOT DETECTED NOT DETECTED Final   Astrovirus NOT DETECTED NOT DETECTED Final   Norovirus GI/GII NOT DETECTED NOT DETECTED Final   Rotavirus A NOT DETECTED NOT DETECTED Final   Sapovirus (I, II, IV, and V) NOT DETECTED NOT DETECTED Final         Radiology Studies: Ct Angio Chest Pe W/cm &/or Wo Cm  03/11/2016  CLINICAL DATA:  Acute onset of congestion, cough, shortness of breath and fever. Initial encounter. EXAM: CT ANGIOGRAPHY CHEST WITH CONTRAST TECHNIQUE: Multidetector CT imaging of the chest was performed using the standard protocol during bolus administration of intravenous contrast. Multiplanar CT image reconstructions and MIPs were obtained to evaluate the vascular anatomy. CONTRAST:  100 mL of Isovue 300 IV contrast COMPARISON:  Chest radiograph performed earlier today at 5:22 p.m. FINDINGS: There is no evidence of pulmonary embolus. Minimal bilateral atelectasis is noted. The lungs are otherwise clear. A prominent bleb is noted at the left upper lobe. There is no evidence of significant focal consolidation, pleural effusion or pneumothorax. No masses are identified; no abnormal focal contrast enhancement  is seen. Mild peribronchial thickening is  noted, without a dominant mass. The mediastinum is grossly unremarkable in appearance. Visualized mediastinal nodes remain normal in size. Scattered calcification is seen along the aortic arch and descending thoracic aorta. The great vessels are grossly unremarkable in appearance. No axillary lymphadenopathy is seen. The visualized portions of the thyroid gland are unremarkable in appearance. The visualized portions of the liver and spleen are unremarkable. The patient is status post cholecystectomy, with clips noted at the gallbladder fossa. The visualized portions of the pancreas, adrenal glands and right kidney are grossly unremarkable. No acute osseous abnormalities are seen. Lumbar spinal fusion hardware is partially imaged, somewhat superiorly angulated. Multilevel vacuum phenomenon is noted along the lower thoracic spine. Review of the MIP images confirms the above findings. IMPRESSION: 1. No evidence of pulmonary embolus. 2. Minimal bilateral atelectasis noted. Lungs otherwise clear. Prominent bleb at the left upper lobe. 3. Mild peribronchial thickening noted. 4. Scattered calcification along the aortic arch and descending thoracic aorta. 5. Degenerative change along the lower thoracic spine. Electronically Signed   By: Roanna Raider M.D.   On: 03/11/2016 19:04   Dg Chest Port 1 View  03/13/2016  CLINICAL DATA:  Pulmonary edema. Shortness of breath today. History of dementia, Parkinson's disease. EXAM: PORTABLE CHEST 1 VIEW COMPARISON:  Chest x-rays dated 03/12/2016 and 03/11/2016. FINDINGS: Cardiomegaly is stable. Overall cardiomediastinal silhouette is stable in size and configuration. Atherosclerotic changes again noted at the aortic arch. The central pulmonary vascular congestion and perihilar edema is not significantly changed. No new lung findings. No pleural effusion or pneumothorax seen. Osseous structures about the chest are unremarkable. IMPRESSION: No significant change compared to  yesterday's chest x-ray. Continued evidence of CHF/volume overload with central pulmonary vascular congestion and bilateral perihilar edema. Electronically Signed   By: Bary Richard M.D.   On: 03/13/2016 16:45   Dg Chest Port 1 View  03/12/2016  CLINICAL DATA:  Acute respiratory failure, cough. EXAM: PORTABLE CHEST 1 VIEW COMPARISON:  Mar 11, 2016. FINDINGS: Stable cardiomediastinal silhouette. Stable central pulmonary vascular congestion is noted. Stable bilateral perihilar edema is noted. No pneumothorax or pleural effusion is noted. Bony thorax is unremarkable. IMPRESSION: Stable central pulmonary vascular congestion is noted with probable bilateral perihilar edema. Electronically Signed   By: Lupita Raider, M.D.   On: 03/12/2016 20:38   Dg Chest Portable 1 View  03/11/2016  CLINICAL DATA:  Shortness of Breath EXAM: PORTABLE CHEST 1 VIEW COMPARISON:  03/11/2016 FINDINGS: Cardiac shadow is stable. The lungs are again well aerated. Increasing vascular congestion with new mild interstitial edema is noted. There is some improved aeration in the right upper lobe when compared with the prior exam. Postsurgical changes are again seen. IMPRESSION: Increased vascular congestion with interstitial edema. Electronically Signed   By: Alcide Clever M.D.   On: 03/11/2016 17:40        Scheduled Meds: . aspirin EC  162 mg Oral Daily  . azithromycin  500 mg Intravenous Q24H  . carvedilol  3.125 mg Oral BID WC  . [START ON 03/14/2016] ceFEPime (MAXIPIME) IV  2 g Intravenous Q24H  . donepezil  5 mg Oral QHS  . escitalopram  10 mg Oral QHS  . furosemide  40 mg Intravenous BID  . gabapentin  300 mg Oral QHS  . insulin aspart  0-9 Units Subcutaneous Q4H  . levalbuterol  1.25 mg Nebulization Q6H  . methylPREDNISolone (SOLU-MEDROL) injection  60 mg Intravenous Q24H   Continuous Infusions: .  heparin 750 Units/hr (03/13/16 0800)     LOS: 2 days    Time spent: 40 minutes     Meryn Sarracino, Roselind Messier, MD [facility] Pager [phone]   If 7PM-7AM, please contact night-coverage [email or web address] Password [email or web address] 03/13/2016, 5:15 PM

## 2016-03-13 NOTE — Progress Notes (Signed)
    Subjective:  Denies CP or dyspnea; per nursing, very confused last PM   Objective:  Filed Vitals:   03/13/16 0433 03/13/16 0500 03/13/16 0600 03/13/16 0700  BP:  128/57 109/45 130/76  Pulse:  73 67 89  Temp: 97.3 F (36.3 C)     TempSrc: Oral     Resp:  27 22 28   Height:      Weight:      SpO2:  100% 100% 97%    Intake/Output from previous day:  Intake/Output Summary (Last 24 hours) at 03/13/16 0811 Last data filed at 03/13/16 0600  Gross per 24 hour  Intake   1365 ml  Output      0 ml  Net   1365 ml    Physical Exam: Physical exam: Well-developed well-nourished in no acute distress.  Skin is warm and dry.  HEENT is normal.  Neck is supple.  Chest is clear to auscultation with normal expansion.  Cardiovascular exam is regular rate and rhythm.  Abdominal exam nontender or distended. No masses palpated. Extremities show no edema. neuro grossly intact; oriented to person only    Lab Results: Basic Metabolic Panel:  Recent Labs  16/07/9604/29/17 0233 03/13/16 0642  NA 140 141  K 3.7 3.3*  CL 110 109  CO2 21* 25  GLUCOSE 141* 86  BUN 17 15  CREATININE 0.75 0.76  CALCIUM 8.1* 8.4*  MG 1.7 1.9  PHOS 4.0 2.7   CBC:  Recent Labs  03/11/16 1514  03/12/16 0233 03/13/16 0642  WBC 10.9*  < > 10.8* 7.8  NEUTROABS 9.0*  --   --  4.5  HGB 15.4*  < > 12.8 12.0  HCT 47.9*  < > 40.3 37.9  MCV 91.1  < > 90.2 91.3  PLT 161  < > 140* 128*  < > = values in this interval not displayed. Cardiac Enzymes:  Recent Labs  03/12/16 0233 03/12/16 1135 03/12/16 1755  TROPONINI 4.00* 3.79* 3.12*     Assessment/Plan:  1 NSTEMI-Echocardiogram reviewed. Patient has anteroseptal and apical hypokinesis suggestive of takotsubo CM. Continue aspirin. Will continue heparin for 24 hours and then discontinue. Discontinue IV metoprolol and begin carvedilol 3.125 mg twice a day. Add ACE inhibitor tomorrow morning if blood pressure allows. Will plan nuclear study prior to  discharge if her mental status improves. We'll plan on repeating echocardiogram in approximately 3 months and hopefully LV function will have improved if this is indeed a stress cardiomyopathy. 2 Cardiomyopathy-management as outlined under #1. 3 dementia-patient confused last evening and this morning. 4 question sepsis-management per critical care medicine.  Olga MillersBrian Cardarius Senat 03/13/2016, 8:11 AM

## 2016-03-13 NOTE — Care Management Note (Signed)
Case Management Note  Patient Details  Name: Willette Bracehyllis Whitner MRN: [id number] Date of Birth: [birth year]  Subjective/Objective:          Pt admitted with sepsis          Action/Plan:  PTA [place] with [place].  [place] from [place] to [place] for sons wedding.  Husband and son at bedside.  CM will continue to monitor for discharge needs   Expected Discharge Date:                  Expected Discharge Plan:  Home/Self Care  In-House Referral:     Discharge planning Services  CM Consult  Post Acute Care Choice:    Choice offered to:     DME Arranged:    DME Agency:     HH Arranged:    HH Agency:     Status of Service:  In process, will continue to follow  Medicare Important Message Given:    Date Medicare IM Given:    Medicare IM give by:    Date Additional Medicare IM Given:    Additional Medicare Important Message give by:     If discussed at Long Length of Stay Meetings, dates discussed:    Additional Comments:  Cherylann ParrClaxton, Caz Weaver S, RN 03/13/2016, 1:52 PM

## 2016-03-13 NOTE — Progress Notes (Signed)
ANTICOAGULATION CONSULT NOTE - Follow Up Consult  Pharmacy Consult for Heparin Indication: chest pain/ACS  No Known Allergies  Patient Measurements: Height: 5\' 2"  (157.5 cm) Weight: 151 lb 0.2 oz (68.5 kg) IBW/kg (Calculated) : 50.1  Vital Signs: Temp: 98.4 F (36.9 C) (05/30 1137) Temp Source: Oral (05/30 1137) BP: 119/91 mmHg (05/30 1400) Pulse Rate: 89 (05/30 1400)  Labs:  Recent Labs  03/11/16 2214 03/12/16 0233 03/12/16 1135 03/12/16 1755 03/12/16 1956 03/13/16 0642 03/13/16 1017  HGB 13.7 12.8  --   --   --  12.0  --   HCT 43.2 40.3  --   --   --  37.9  --   PLT 134* 140*  --   --   --  128*  --   HEPARINUNFRC  --   --  0.38  --  0.27*  --  0.36  CREATININE 0.81 0.75  --   --   --  0.76  --   TROPONINI  --  4.00* 3.79* 3.12*  --   --   --     Estimated Creatinine Clearance: 48.4 mL/min (by C-G formula based on Cr of 0.76).   Medications:  Infusions:  . heparin 750 Units/hr (03/13/16 0800)    Assessment: 82 year old female admitted with shortness of breath. She is receiving anticoagulation with heparin for 24 more hours for presumed takotsubo cardiomyopathy. Her heparin level is therapeutic. Her hemoglobin and platelet count are stable. No bleeding noted.  She is also being treated with cefepime/azithromycin/flagyl for sepsis.  Her cefepime dose required adjustment for CrCl ~45 ml/min.  Goal of Therapy:  Heparin level 0.3-0.7 units/ml Monitor platelets by anticoagulation protocol: Yes   Plan:  Continue Heparin at 750 units/hr Daily HL and CBC Anticipate heparin will stop 5/31 based on Dr. Ludwig Clarksrenshaw's note Decrease Cefepime to 2g IV q24h Watch renal function closely  Estella HuskMichelle Shaketta Rill, Pharm.D., BCPS, AAHIVP Clinical Pharmacist Phone: [phone] or [phone] 03/13/2016, 2:36 PM

## 2016-03-14 ENCOUNTER — Encounter (HOSPITAL_COMMUNITY): Payer: Self-pay | Admitting: *Deleted

## 2016-03-14 DIAGNOSIS — J9601 Acute respiratory failure with hypoxia: Secondary | ICD-10-CM | POA: Diagnosis present

## 2016-03-14 DIAGNOSIS — J810 Acute pulmonary edema: Secondary | ICD-10-CM

## 2016-03-14 DIAGNOSIS — R7989 Other specified abnormal findings of blood chemistry: Secondary | ICD-10-CM

## 2016-03-14 LAB — GLUCOSE, CAPILLARY
GLUCOSE-CAPILLARY: 146 mg/dL — AB (ref 65–99)
Glucose-Capillary: 147 mg/dL — ABNORMAL HIGH (ref 65–99)
Glucose-Capillary: 144 mg/dL — ABNORMAL HIGH (ref 65–99)
Glucose-Capillary: 160 mg/dL — ABNORMAL HIGH (ref 65–99)

## 2016-03-14 LAB — BASIC METABOLIC PANEL
Anion gap: 9 (ref 5–15)
BUN: 12 mg/dL (ref 6–20)
CHLORIDE: 108 mmol/L (ref 101–111)
CO2: 22 mmol/L (ref 22–32)
CREATININE: 0.68 mg/dL (ref 0.44–1.00)
Calcium: 8.4 mg/dL — ABNORMAL LOW (ref 8.9–10.3)
Glucose, Bld: 147 mg/dL — ABNORMAL HIGH (ref 65–99)
POTASSIUM: 4.3 mmol/L (ref 3.5–5.1)
SODIUM: 139 mmol/L (ref 135–145)

## 2016-03-14 LAB — CBC
HEMATOCRIT: 42.3 % (ref 36.0–46.0)
HEMOGLOBIN: 13.7 g/dL (ref 12.0–15.0)
MCH: 29.2 pg (ref 26.0–34.0)
MCHC: 32.4 g/dL (ref 30.0–36.0)
MCV: 90.2 fL (ref 78.0–100.0)
Platelets: 125 10*3/uL — ABNORMAL LOW (ref 150–400)
RBC: 4.69 MIL/uL (ref 3.87–5.11)
RDW: 15.4 % (ref 11.5–15.5)
WBC: 4.4 10*3/uL (ref 4.0–10.5)

## 2016-03-14 LAB — OCCULT BLOOD X 1 CARD TO LAB, STOOL: Fecal Occult Bld: NEGATIVE

## 2016-03-14 LAB — MAGNESIUM: MAGNESIUM: 2 mg/dL (ref 1.7–2.4)

## 2016-03-14 LAB — PROCALCITONIN: PROCALCITONIN: 0.69 ng/mL

## 2016-03-14 LAB — HEPARIN LEVEL (UNFRACTIONATED): HEPARIN UNFRACTIONATED: 0.4 [IU]/mL (ref 0.30–0.70)

## 2016-03-14 MED ORDER — IPRATROPIUM BROMIDE 0.02 % IN SOLN
0.5000 mg | Freq: Four times a day (QID) | RESPIRATORY_TRACT | Status: DC
  Administered 2016-03-14 – 2016-03-17 (×11): 0.5 mg via RESPIRATORY_TRACT
  Filled 2016-03-14 (×11): qty 2.5

## 2016-03-14 MED ORDER — HEPARIN SODIUM (PORCINE) 5000 UNIT/ML IJ SOLN
5000.0000 [IU] | Freq: Three times a day (TID) | INTRAMUSCULAR | Status: DC
  Administered 2016-03-14 – 2016-03-17 (×10): 5000 [IU] via SUBCUTANEOUS
  Filled 2016-03-14 (×11): qty 1

## 2016-03-14 MED ORDER — LEVALBUTEROL HCL 1.25 MG/0.5ML IN NEBU
1.2500 mg | INHALATION_SOLUTION | Freq: Four times a day (QID) | RESPIRATORY_TRACT | Status: DC
  Administered 2016-03-14 – 2016-03-17 (×11): 1.25 mg via RESPIRATORY_TRACT
  Filled 2016-03-14 (×12): qty 0.5

## 2016-03-14 MED ORDER — HALOPERIDOL LACTATE 5 MG/ML IJ SOLN: 1.0000 mg | Freq: Four times a day (QID) | INTRAMUSCULAR | Status: DC | PRN

## 2016-03-14 MED ORDER — LEVALBUTEROL HCL 0.63 MG/3ML IN NEBU: 0.6300 mg | INHALATION_SOLUTION | RESPIRATORY_TRACT | Status: DC | PRN

## 2016-03-14 MED ORDER — DIPHENHYDRAMINE HCL 50 MG/ML IJ SOLN: 25.0000 mg | Freq: Three times a day (TID) | INTRAMUSCULAR | Status: DC | PRN

## 2016-03-14 MED ORDER — CAPTOPRIL 6.25 MG HALF TABLET
6.2500 mg | ORAL_TABLET | Freq: Three times a day (TID) | ORAL | Status: DC
  Administered 2016-03-14 (×3): 6.25 mg via ORAL
  Filled 2016-03-14 (×5): qty 1

## 2016-03-14 NOTE — Progress Notes (Signed)
    Subjective:  Denies CP or dyspnea; still confused   Objective:  Filed Vitals:   03/14/16 0429 03/14/16 0500 03/14/16 0600 03/14/16 0700  BP:  130/69 117/64 115/61  Pulse:  72 63 61  Temp:      TempSrc:      Resp:  17 16 17   Height:      Weight: 152 lb 5.4 oz (69.1 kg)     SpO2:  99% 99% 98%    Intake/Output from previous day:  Intake/Output Summary (Last 24 hours) at 03/14/16 0726 Last data filed at 03/14/16 0700  Gross per 24 hour  Intake    905 ml  Output   1925 ml  Net  -1020 ml    Physical Exam: Physical exam: Well-developed well-nourished in no acute distress.  Skin is warm and dry.  HEENT is normal.  Neck is supple.  Chest with diffuse exp wheeze Cardiovascular exam is regular rate and rhythm.  Abdominal exam nontender or distended. No masses palpated. Extremities show no edema. neuro grossly intact; oriented to person only    Lab Results: Basic Metabolic Panel:  Recent Labs  16/07/9604/29/17 0233 03/13/16 0642 03/14/16 0235  NA 140 141 139  K 3.7 3.3* 4.3  CL 110 109 108  CO2 21* 25 22  GLUCOSE 141* 86 147*  BUN 17 15 12   CREATININE 0.75 0.76 0.68  CALCIUM 8.1* 8.4* 8.4*  MG 1.7 1.9 2.0  PHOS 4.0 2.7  --    CBC:  Recent Labs  03/11/16 1514  03/13/16 0642 03/14/16 0235  WBC 10.9*  < > 7.8 4.4  NEUTROABS 9.0*  --  4.5  --   HGB 15.4*  < > 12.0 13.7  HCT 47.9*  < > 37.9 42.3  MCV 91.1  < > 91.3 90.2  PLT 161  < > 128* 125*  < > = values in this interval not displayed. Cardiac Enzymes:  Recent Labs  03/12/16 0233 03/12/16 1135 03/12/16 1755  TROPONINI 4.00* 3.79* 3.12*     Assessment/Plan:  1 NSTEMI-Echocardiogram previously reviewed. Patient has anteroseptal and apical hypokinesis suggestive of takotsubo CM. Continue aspirin. Continue carvedilol 3.125 mg twice a day. Add low dose captopril to see if BP will tolerate. Chest xray with mild edema; on lasix although doubt she will need that dose long term. DC heparin. Will plan  nuclear study prior to discharge if her mental status improves. We'll plan on repeating echocardiogram in approximately 3 months and hopefully LV function will have improved if this is indeed a stress cardiomyopathy. 2 Cardiomyopathy-management as outlined under #1. 3 dementia-patient confused last evening and this morning. 4 question sepsis-management per critical care medicine.  Olga MillersBrian Chezney Huether 03/14/2016, 7:26 AM

## 2016-03-14 NOTE — Care Management Note (Addendum)
Case Management Note  Patient Details  Name: Willette Bracehyllis Tsukamoto MRN: [id number] Date of Birth: 02/07/1930  Subjective/Objective:          Pt admitted with sepsis          Action/Plan:  PTA - independent from home ([place] with husband.  Pt was traveling from [place] to [place] for sons wedding.  Husband and son at bedside.  CM will continue to monitor for discharge needs   Expected Discharge Date:                  Expected Discharge Plan:  Home/Self Care  In-House Referral:     Discharge planning Services  CM Consult  Post Acute Care Choice:    Choice offered to:     DME Arranged:    DME Agency:     HH Arranged:    HH Agency:     Status of Service:  In process, will continue to follow  Medicare Important Message Given:    Date Medicare IM Given:    Medicare IM give by:    Date Additional Medicare IM Given:    Additional Medicare Important Message give by:     If discussed at Long Length of Stay Meetings, dates discussed:    Additional Comments: 03/14/2016  Attending spoke with husband and son in detail regarding transfer.  CM also spoke with husband in detail - husband stated he wanted to know how much transfer would cost - CM contacted [facility]; approximate cost of $1100.  CM communicated the cost to husband - family and attending to reassess transfer in the am.  CM consulted for possible transfer to [facility] [place].  CM spoke with attending regarding needing an accepting physician.  Attending will speak with family today as transfer is not considered medically necessary and follow back up with CM if family still wants transfer - to assist with transportation needs from acute to acute facility.  Cherylann ParrClaxton, Myshawn Chiriboga S, RN 03/14/2016, 9:02 AM

## 2016-03-14 NOTE — Evaluation (Signed)
Physical Therapy Evaluation Patient Details Name: Cassandra Tanner MRN: [id number] DOB: [birth year] Today's Date: 03/14/2016   History of Present Illness  This [age] year female, from [place], was visiting for grandson's wedding. She developed worsening dyspnea, AMS and abdominal pain.  She has a h/o dementia  Clinical Impression  Patient presents with generalized weakness, dyspnea on exertion and balance deficits impacting mobility. Tolerated gait training with Min A for balance/safety. Sp02 remained 92% on RA. Pt and spouse will be going to stay with son in [place] a few days prior to return home to [place]. Pt will need to negotiate 4 flights of stairs tog et into son's home. Discussed having a chair at each landing for adequate rest breaks. Will plan for stair training tomorrow as tolerated. Will follow acutely to maximize independence and mobility prior to return home.    Follow Up Recommendations No PT follow up;Supervision for mobility/OOB    Equipment Recommendations  None recommended by PT    Recommendations for Other Services       Precautions / Restrictions Precautions Precautions: Fall Restrictions Weight Bearing Restrictions: No      Mobility  Bed Mobility Overal bed mobility: Needs Assistance Bed Mobility: Supine to Sit     Supine to sit: Min assist     General bed mobility comments: UP in chair upon PT arrival.   Transfers Overall transfer level: Needs assistance Equipment used: 1 person hand held assist Transfers: Sit to/from Stand Sit to Stand: Min guard Stand pivot transfers: Min assist       General transfer comment: Min guard for steadying in standing.  Ambulation/Gait Ambulation/Gait assistance: Min assist Ambulation Distance (Feet): 70 Feet Assistive device: 1 person hand held assist Gait Pattern/deviations: Step-through pattern;Decreased stride length;Shuffle;Trunk flexed Gait velocity: decreased   General Gait Details: Slow, unsteady gait with handheld  assist for support. Sp02 92% on RA post ambulation bout. Productive cough.  Stairs            Wheelchair Mobility    Modified Rankin (Stroke Patients Only)       Balance Overall balance assessment: Needs assistance Sitting-balance support: Feet supported;No upper extremity supported Sitting balance-Leahy Scale: Fair     Standing balance support: During functional activity Standing balance-Leahy Scale: Poor Standing balance comment: Reliant on UEs for support.                              Pertinent Vitals/Pain Pain Assessment: No/denies pain    Home Living Family/patient expects to be discharged to:: Private residence Living Arrangements: Spouse/significant other Available Help at Discharge: Family;Available 24 hours/day         Home Layout: Two level   Additional Comments: Husband plans to take pt to son's in [place] for a couple of days prior to flying back to [place]    Prior Function Level of Independence: Needs assistance         Comments: Pt/husband live in ALF.  He assists as she needs it. At first he said that she used w/c but then said she gets around (furniture walking gestured).  Pt gets to bathroom on her own. She is very active with activities:  yoga, bingo, movie nights     Hand Dominance   Dominant Hand: Left    Extremity/Trunk Assessment   Upper Extremity Assessment: Defer to OT evaluation           Lower Extremity Assessment: Generalized weakness  Communication   Communication: HOH  Cognition Arousal/Alertness: Awake/alert Behavior During Therapy: WFL for tasks assessed/performed Overall Cognitive Status: History of cognitive impairments - at baseline                      General Comments General comments (skin integrity, edema, etc.): spouse and son present inr oom during session.    Exercises        Assessment/Plan    PT Assessment Patient needs continued PT services  PT Diagnosis  Difficulty walking;Altered mental status;Generalized weakness   PT Problem List Decreased strength;Pain;Cardiopulmonary status limiting activity;Decreased activity tolerance;Decreased cognition;Decreased balance;Decreased mobility;Decreased knowledge of precautions  PT Treatment Interventions Balance training;Gait training;Functional mobility training;Therapeutic activities;Therapeutic exercise;Patient/family education;Stair training   PT Goals (Current goals can be found in the Care Plan section) Acute Rehab PT Goals Patient Stated Goal: husbands:  get pt back to [place] after a couple of night's at sons in [place] PT Goal Formulation: With patient/family Time For Goal Achievement: 03/28/16 Potential to Achieve Goals: Good    Frequency Min 3X/week   Barriers to discharge Inaccessible home environment 4 flights of stairs to get to son's house    Co-evaluation               End of Session Equipment Utilized During Treatment: Gait belt Activity Tolerance: Patient tolerated treatment well Patient left: in chair;with call bell/phone within reach;with nursing/sitter in room;with family/visitor present Nurse Communication: Mobility status         Time: 4098-1191 PT Time Calculation (min) (ACUTE ONLY): 21 min   Charges:   PT Evaluation $PT Eval Moderate Complexity: 1 Procedure     PT G Codes:        Cherryl Babin A Phyillis Dascoli 03/14/2016, 4:44 PM Mylo Red, PT, DPT [phone]

## 2016-03-14 NOTE — Evaluation (Signed)
Occupational Therapy Evaluation Patient Details Name: Cassandra Tanner MRN: [id number] DOB: Jun 25, 1930 Today's Date: 03/14/2016    History of Present Illness This [age] year female, from [place], was visiting for grandson's wedding. She developed worsening dyspnea, AMS and abdominal pain.  She has a h/o dementia   Clinical Impression   Pt was admitted for the above.  Pt's husband and she live together in an ALF and he assists her as needed.  He reports that she is mod I with toileting--either taking w/c to bathroom or furniture walking there. Will follow in acute setting focusing on toileting.  Goals are for min guard level    Follow Up Recommendations  Supervision/Assistance - 24 hour    Equipment Recommendations  None recommended by OT    Recommendations for Other Services       Precautions / Restrictions Precautions Precautions: Fall Restrictions Weight Bearing Restrictions: No      Mobility Bed Mobility Overal bed mobility: Needs Assistance Bed Mobility: Supine to Sit     Supine to sit: Min assist     General bed mobility comments: min support for trunk.  used bedrail  Transfers Overall transfer level: Needs assistance Equipment used: 1 person hand held assist Transfers: Sit to/from [employer]Stand;Stand Pivot Transfers Sit to Stand: Min assist Stand pivot transfers: Min assist       General transfer comment: steadying assistance    Balance                                            ADL Overall ADL's : Needs assistance/impaired     Grooming: Set up;Sitting                   Toilet Transfer: Minimal assistance;BSC;RW;Stand-pivot   Toileting- Clothing Manipulation and Hygiene: Minimal assistance;Sit to/from stand         General ADL Comments: [facility] to recliner, [facility] and back to recliner.  Husband and son present. At first husband said that he helped pt with adls but then said she got to commode on her own.       Vision      Perception     Praxis      Pertinent Vitals/Pain       Hand Dominance Left   Extremity/Trunk Assessment Upper Extremity Assessment Upper Extremity Assessment: Overall WFL for tasks assessed (grossly 4-/5)           Communication Communication Communication: HOH   Cognition Arousal/Alertness: Awake/alert Behavior During Therapy: WFL for tasks assessed/performed Overall Cognitive Status: History of cognitive impairments - at baseline                     General Comments       Exercises       Shoulder Instructions      Home Living Family/patient expects to be discharged to:: Private residence                                 Additional Comments: Husband plans to take pt to son's in [place] for a couple of days prior to flying back to [place]      Prior Functioning/Environment Level of Independence: Needs assistance        Comments: Pt/husband live in ALF, which is set up well and accessible per husband.  He assists as she needs it. At first he said that she used w/c but then said she gets around (furniture walking gestured).  Pt gets to bathroom on her own. She is very active with activities:  yoga, bingo, movie nights    OT Diagnosis: Generalized weakness   OT Problem List: Decreased strength;Decreased activity tolerance;Impaired balance (sitting and/or standing);Decreased cognition   OT Treatment/Interventions: Self-care/ADL training;DME and/or AE instruction;Balance training;Patient/family education;Therapeutic activities    OT Goals(Current goals can be found in the care plan section) Acute Rehab OT Goals Patient Stated Goal: husbands:  get pt back to [place] after a couple of night's at sons in [place] OT Goal Formulation: With family Time For Goal Achievement: 03/21/16 Potential to Achieve Goals: Good ADL Goals Pt Will Perform Grooming: with min guard assist;standing Pt Will Perform Toileting - Clothing Manipulation and hygiene: with min  guard assist;sit to/from stand Additional ADL Goal #1: pt has will perform SPT to commode with min guard vs ambulating with min guard and AD  OT Frequency: Min 2X/week   Barriers to D/C:            Co-evaluation              End of Session    Activity Tolerance: Patient tolerated treatment well Patient left: in chair;with call bell/phone within reach;with nursing/sitter in room;with family/visitor present Psychiatrist)   Time: 1541-1600 OT Time Calculation (min): 19 min Charges:  OT General Charges $OT Visit: 1 Procedure OT Evaluation $OT Eval Low Complexity: 1 Procedure G-Codes:    Cassandra Tanner 04-11-16, 4:20 PM  Cassandra Tanner, OTR/L [phone] 04/11/16

## 2016-03-14 NOTE — Progress Notes (Signed)
Cassandra Tanner TEAM 1 - Stepdown/ICU Cassandra LAMPSON Cassandra  Tanner:[id number] DOB: 12-16-1929 DOA: 03/11/2016 PCP: No primary care provider on file.    Brief Narrative:  82 y/o F who was visiting from [place] for her grandson's wedding when she developed worsening dyspnea, fever, altered mental status, and abdominal pain. Her husband reported she was in her usual state of health until 48 hours prior to presentation when she developed diarrhea.  This was accompanied by abd pain with poor PO intake for 24 hours prior to presentation. She also developed "gurgling" sounds while breathing, and had increased WOB. Her family was worried and brought her to the ED.  Assessment & Plan:  Severe Sepsis due to Parainfluenza virus 3 URI / bronchitis w/ acute hypoxic resp failure  -complete 5 day course of azithromycin to cover any potential bacterial superinfection - resp status much improved - stats currently 100% on 2L  NSTEMI - Takotsubo Cardiomyopathy -care as per [facility] - cont ASA + Coreg - ACEi added - stopping heparin today - eventual nuclear study when more stable  - will need f/u TTE in 3 months   GI bleed? - ruled out  -RN Boyd Kerbs noticed possible blood in stool but fecal occult was negative   Possible Infectious Diarrhea - ruled out  -C. difficile/stool PCR negative - GI pathogen panel negative - stopped abx tx for this issue   Thrombocytopenia - Mild -plt count appears to be stabilizing - follow trend   Hyperglycemia  -No h/o DM - A1c 5.9  Dementia -Aricept 5 mg QHS - Lexapro 10 mg QHS - Neurontin 300 mg QHS  Hypokalemia -corrected   Hypomagnesemia -corrected   Goals of care -advised husband that I would like to assure she remains stable after transfer to a tele bed before considering an elective transfer, and that if she continues to improve at her current rate, she may even be ready for d/c in 48-72hrs (depending upon her PT/OT findings) - will re-address in the AM  DVT  prophylaxis: SQ heparin  Code Status: FULL CODE Family Communication: no family present at time of exam  Disposition Plan: transfer to tele bed - advance diet - begin PT/OT   Consultants:  PCCM  [facility]   Procedures: 5/28 CTA Chest - No PE, no infiltrate. Mild peribronchial thickening. 5/29 TTE - EF 30-35%. Akinesis of anteroseptal & apical myocardium. Grade 1 diastolic dysfunction. Trivial AR. Moderate TR. Findings suggestive of Takotsubo cardiomyopathy. RV systolic function normal. No pericardial effusion  Antimicrobials:  Rocphin 5/28 Azithromycin 5/28 > Vanc PO 5/28 > 5/29 Vanc IV 5/28 > 5/29 Flagyl IV 5/28 > 5/30 Cefepime 5/28 > 5/31  Subjective: Pt is alert but modestly confused.  Not agitated or combative.  She denies cp, sob, n/v, or abdom pain.    Objective: Blood pressure 107/59, pulse 65, temperature 98.3 F (36.8 C), temperature source Oral, resp. rate 18, height 5\' 2"  (1.575 m), weight 69.1 kg (152 lb 5.4 oz), SpO2 98 %.  Intake/Output Summary (Last 24 hours) at 03/14/16 0959 Last data filed at 03/14/16 0800  Gross per 24 hour  Intake  727.5 ml  Output   1965 ml  Net -1237.5 ml   Filed Weights   03/12/16 0409 03/13/16 0410 03/14/16 0429  Weight: 67.4 kg (148 lb 9.4 oz) 68.5 kg (151 lb 0.2 oz) 69.1 kg (152 lb 5.4 oz)    Examination: General: No acute respiratory distress at rest in bed  Lungs: diffuse crackles - no  signif wheeze  Cardiovascular: Regular rate and rhythm without murmur gallop or rub - HS are distant  Abdomen: Nontender, nondistended, soft, bowel sounds positive, no rebound, no ascites, no appreciable mass Extremities: No significant cyanosis, clubbing, or edema bilateral lower extremities  CBC:  Recent Labs Lab 03/11/16 1514 03/11/16 2214 03/12/16 0233 03/13/16 0642 03/14/16 0235  WBC 10.9* 13.7* 10.8* 7.8 4.4  NEUTROABS 9.0*  --   --  4.5  --   HGB 15.4* 13.7 12.8 12.0 13.7  HCT 47.9* 43.2 40.3 37.9 42.3  MCV 91.1 90.2  90.2 91.3 90.2  PLT 161 134* 140* 128* 125*   Basic Metabolic Panel:  Recent Labs Lab 03/11/16 1514 03/11/16 2214 03/12/16 0233 03/13/16 0642 03/14/16 0235  NA 140  --  140 141 139  K 3.5  --  3.7 3.3* 4.3  CL 107  --  110 109 108  CO2 22  --  21* 25 22  GLUCOSE 164*  --  141* 86 147*  BUN 17  --  17 15 12   CREATININE 0.76 0.81 0.75 0.76 0.68  CALCIUM 9.3  --  8.1* 8.4* 8.4*  MG  --  1.8 1.7 1.9 2.0  PHOS  --  3.5 4.0 2.7  --    GFR: Estimated Creatinine Clearance: 48.5 mL/min (by C-G formula based on Cr of 0.68).  Liver Function Tests:  Recent Labs Lab 03/11/16 1514 03/13/16 0642  AST 25  --   ALT 15  --   ALKPHOS 73  --   BILITOT 0.7  --   PROT 6.9  --   ALBUMIN 3.7 2.8*    Recent Labs Lab 03/11/16 2214  LIPASE 26    Cardiac Enzymes:  Recent Labs Lab 03/11/16 1723 03/11/16 2050 03/12/16 0233 03/12/16 1135 03/12/16 1755  TROPONINI 0.08* 2.33* 4.00* 3.79* 3.12*    HbA1C: HGB A1C MFR BLD  Date/Time Value Ref Range Status  03/12/2016 03:07 PM 5.9* 4.8 - 5.6 % Final    Comment:    (NOTE)         Pre-diabetes: 5.7 - 6.4         Diabetes: >6.4         Glycemic control for adults with diabetes: <7.0     CBG:  Recent Labs Lab 03/13/16 1540 03/13/16 1945 03/13/16 2358 03/14/16 0402 03/14/16 0825  GLUCAP 129* 195* 146* 147* 144*    Recent Results (from the past 240 hour(s))  Culture, blood (Routine x 2)     Status: None (Preliminary result)   Collection Time: 03/11/16  3:14 PM  Result Value Ref Range Status   Specimen Description BLOOD LEFT ANTECUBITAL  Final   Special Requests BOTTLES DRAWN AEROBIC AND ANAEROBIC 5 CC  Final   Culture NO GROWTH 2 DAYS  Final   Report Status PENDING  Incomplete  Culture, blood (Routine x 2)     Status: None (Preliminary result)   Collection Time: 03/11/16  4:13 PM  Result Value Ref Range Status   Specimen Description BLOOD RIGHT HAND  Final   Special Requests IN PEDIATRIC BOTTLE 4 ML  Final    Culture NO GROWTH 2 DAYS  Final   Report Status PENDING  Incomplete  Urine culture     Status: None   Collection Time: 03/11/16  4:32 PM  Result Value Ref Range Status   Specimen Description URINE, CATHETERIZED  Final   Special Requests NONE  Final   Culture NO GROWTH  Final   Report Status  03/12/2016 FINAL  Final  MRSA PCR Screening     Status: None   Collection Time: 03/11/16 11:47 PM  Result Value Ref Range Status   MRSA by PCR NEGATIVE NEGATIVE Final    Comment:        The GeneXpert MRSA Assay (FDA approved for NASAL specimens only), is one component of a comprehensive MRSA colonization surveillance program. It is not intended to diagnose MRSA infection nor to guide or monitor treatment for MRSA infections.   C difficile quick scan w PCR reflex     Status: None   Collection Time: 03/12/16 10:12 AM  Result Value Ref Range Status   C Diff antigen NEGATIVE NEGATIVE Final   C Diff toxin NEGATIVE NEGATIVE Final   C Diff interpretation Negative for toxigenic C. difficile  Final  Respiratory Panel by PCR     Status: Abnormal   Collection Time: 03/12/16  2:19 PM  Result Value Ref Range Status   Adenovirus NOT DETECTED NOT DETECTED Final   Coronavirus 229E NOT DETECTED NOT DETECTED Final   Coronavirus HKU1 NOT DETECTED NOT DETECTED Final   Coronavirus NL63 NOT DETECTED NOT DETECTED Final   Coronavirus OC43 NOT DETECTED NOT DETECTED Final   Metapneumovirus NOT DETECTED NOT DETECTED Final   Rhinovirus / Enterovirus NOT DETECTED NOT DETECTED Final   Influenza A NOT DETECTED NOT DETECTED Final   Influenza A H1 NOT DETECTED NOT DETECTED Final   Influenza A H1 2009 NOT DETECTED NOT DETECTED Final   Influenza A H3 NOT DETECTED NOT DETECTED Final   Influenza B NOT DETECTED NOT DETECTED Final   Parainfluenza Virus 1 NOT DETECTED NOT DETECTED Corrected    Comment: CORRECTED ON 05/29 AT 1756: PREVIOUSLY REPORTED AS DETECTED   Parainfluenza Virus 2 NOT DETECTED NOT DETECTED Final    Parainfluenza Virus 3 DETECTED (A) NOT DETECTED Corrected    Comment: CORRECTED ON 05/29 AT 1756: PREVIOUSLY REPORTED AS NOT DETECTED   Parainfluenza Virus 4 NOT DETECTED NOT DETECTED Final   Respiratory Syncytial Virus NOT DETECTED NOT DETECTED Final   Bordetella pertussis NOT DETECTED NOT DETECTED Final   Chlamydophila pneumoniae NOT DETECTED NOT DETECTED Final   Mycoplasma pneumoniae NOT DETECTED NOT DETECTED Final  Gastrointestinal Panel by PCR , Stool     Status: None   Collection Time: 03/12/16  6:36 PM  Result Value Ref Range Status   Campylobacter species NOT DETECTED NOT DETECTED Final   Plesimonas shigelloides NOT DETECTED NOT DETECTED Final   Salmonella species NOT DETECTED NOT DETECTED Final   Yersinia enterocolitica NOT DETECTED NOT DETECTED Final   Vibrio species NOT DETECTED NOT DETECTED Final   Vibrio cholerae NOT DETECTED NOT DETECTED Final   Enteroaggregative E coli (EAEC) NOT DETECTED NOT DETECTED Final   Enteropathogenic E coli (EPEC) NOT DETECTED NOT DETECTED Final   Enterotoxigenic E coli (ETEC) NOT DETECTED NOT DETECTED Final   Shiga like toxin producing E coli (STEC) NOT DETECTED NOT DETECTED Final   E. coli O157 NOT DETECTED NOT DETECTED Final   Shigella/Enteroinvasive E coli (EIEC) NOT DETECTED NOT DETECTED Final   Cryptosporidium NOT DETECTED NOT DETECTED Final   Cyclospora cayetanensis NOT DETECTED NOT DETECTED Final   Entamoeba histolytica NOT DETECTED NOT DETECTED Final   Giardia lamblia NOT DETECTED NOT DETECTED Final   Adenovirus F40/41 NOT DETECTED NOT DETECTED Final   Astrovirus NOT DETECTED NOT DETECTED Final   Norovirus GI/GII NOT DETECTED NOT DETECTED Final   Rotavirus A NOT DETECTED NOT DETECTED Final  Sapovirus (I, II, IV, and V) NOT DETECTED NOT DETECTED Final     Scheduled Meds: . aspirin EC  162 mg Oral Daily  . azithromycin  500 mg Intravenous Q24H  . captopril  6.25 mg Oral TID  . carvedilol  3.125 mg Oral BID WC  . ceFEPime  (MAXIPIME) IV  2 g Intravenous Q24H  . donepezil  5 mg Oral QHS  . escitalopram  10 mg Oral QHS  . furosemide  40 mg Intravenous BID  . gabapentin  300 mg Oral QHS  . heparin subcutaneous  5,000 Units Subcutaneous Q8H  . insulin aspart  0-9 Units Subcutaneous Q4H  . levalbuterol  1.25 mg Nebulization Q6H  . methylPREDNISolone (SOLU-MEDROL) injection  60 mg Intravenous Q24H     LOS: 3 days   Time spent: 35 minutes   Lonia Blood, MD [facility] Office  [phone] Pager - Text Page per Loretha Stapler as per below:  On-Call/Text Page:      [email or web address]      password [email or web address]  If 7PM-7AM, please contact night-coverage [email or web address] Password [email or web address] 03/14/2016, 9:59 AM

## 2016-03-15 DIAGNOSIS — J811 Chronic pulmonary edema: Secondary | ICD-10-CM | POA: Diagnosis present

## 2016-03-15 DIAGNOSIS — J810 Acute pulmonary edema: Secondary | ICD-10-CM | POA: Diagnosis present

## 2016-03-15 LAB — MAGNESIUM: Magnesium: 1.9 mg/dL (ref 1.7–2.4)

## 2016-03-15 LAB — CBC
HEMATOCRIT: 41.5 % (ref 36.0–46.0)
HEMOGLOBIN: 13.2 g/dL (ref 12.0–15.0)
MCH: 28.3 pg (ref 26.0–34.0)
MCHC: 31.8 g/dL (ref 30.0–36.0)
MCV: 88.9 fL (ref 78.0–100.0)
Platelets: 180 10*3/uL (ref 150–400)
RBC: 4.67 MIL/uL (ref 3.87–5.11)
RDW: 15.2 % (ref 11.5–15.5)
WBC: 8.9 10*3/uL (ref 4.0–10.5)

## 2016-03-15 LAB — BASIC METABOLIC PANEL
Anion gap: 9 (ref 5–15)
BUN: 22 mg/dL — AB (ref 6–20)
CALCIUM: 8.8 mg/dL — AB (ref 8.9–10.3)
CO2: 27 mmol/L (ref 22–32)
CREATININE: 0.97 mg/dL (ref 0.44–1.00)
Chloride: 105 mmol/L (ref 101–111)
GFR calc Af Amer: >60 mL/min (ref >60)
GFR, EST NON AFRICAN AMERICAN: 53 mL/min — AB (ref >60)
GLUCOSE: 100 mg/dL — AB (ref 65–99)
POTASSIUM: 3.2 mmol/L — AB (ref 3.5–5.1)
SODIUM: 141 mmol/L (ref 135–145)

## 2016-03-15 MED ORDER — LISINOPRIL 2.5 MG PO TABS
2.5000 mg | ORAL_TABLET | Freq: Every day | ORAL | Status: DC
  Administered 2016-03-15: 2.5 mg via ORAL
  Filled 2016-03-15: qty 1

## 2016-03-15 MED ORDER — POTASSIUM CHLORIDE 10 MEQ/100ML IV SOLN
10.0000 meq | Freq: Once | INTRAVENOUS | Status: AC
  Administered 2016-03-15: 10 meq via INTRAVENOUS

## 2016-03-15 MED ORDER — AZITHROMYCIN 500 MG PO TABS
500.0000 mg | ORAL_TABLET | Freq: Every day | ORAL | Status: DC
  Administered 2016-03-15 – 2016-03-17 (×3): 500 mg via ORAL
  Filled 2016-03-15 (×3): qty 1

## 2016-03-15 MED ORDER — ARFORMOTEROL TARTRATE 15 MCG/2ML IN NEBU
15.0000 ug | INHALATION_SOLUTION | Freq: Two times a day (BID) | RESPIRATORY_TRACT | Status: DC
  Administered 2016-03-15 – 2016-03-17 (×4): 15 ug via RESPIRATORY_TRACT
  Filled 2016-03-15 (×4): qty 2

## 2016-03-15 MED ORDER — DIPHENHYDRAMINE HCL 25 MG PO CAPS: 25.0000 mg | ORAL_CAPSULE | Freq: Three times a day (TID) | ORAL | Status: DC | PRN

## 2016-03-15 MED ORDER — FUROSEMIDE 20 MG PO TABS
20.0000 mg | ORAL_TABLET | Freq: Every day | ORAL | Status: DC
  Administered 2016-03-15 – 2016-03-17 (×3): 20 mg via ORAL
  Filled 2016-03-15 (×3): qty 1

## 2016-03-15 MED ORDER — POTASSIUM CHLORIDE 10 MEQ/100ML IV SOLN
10.0000 meq | INTRAVENOUS | Status: AC
  Administered 2016-03-15 (×4): 10 meq via INTRAVENOUS
  Filled 2016-03-15 (×3): qty 100

## 2016-03-15 MED ORDER — POTASSIUM CHLORIDE 10 MEQ/100ML IV SOLN
INTRAVENOUS | Status: AC
  Administered 2016-03-15: 10 meq
  Filled 2016-03-15: qty 100

## 2016-03-15 MED ORDER — SPIRONOLACTONE 25 MG PO TABS
12.5000 mg | ORAL_TABLET | Freq: Every day | ORAL | Status: DC
  Administered 2016-03-15 – 2016-03-17 (×3): 12.5 mg via ORAL
  Filled 2016-03-15 (×3): qty 1

## 2016-03-15 MED ORDER — ARFORMOTEROL TARTRATE 15 MCG/2ML IN NEBU: 15.0000 ug | INHALATION_SOLUTION | Freq: Two times a day (BID) | RESPIRATORY_TRACT | Status: DC

## 2016-03-15 MED ORDER — MAGNESIUM SULFATE 2 GM/50ML IV SOLN
2.0000 g | Freq: Once | INTRAVENOUS | Status: AC
  Administered 2016-03-15: 2 g via INTRAVENOUS
  Filled 2016-03-15 (×3): qty 50

## 2016-03-15 MED ORDER — POTASSIUM CHLORIDE 10 MEQ/100ML IV SOLN
INTRAVENOUS | Status: AC
  Filled 2016-03-15: qty 100

## 2016-03-15 NOTE — Progress Notes (Signed)
PROGRESS NOTE    Cassandra Tanner  NWG:[id number] DOB: 1930-09-21 DOA: 03/11/2016 PCP: No primary care provider on file.   Brief Narrative:  [name] is an 82 y/o WF PMHx Unk? From [place] who was visiting [facility] for her grandson's wedding when she developed worsening dyspnea, fevers, altered mental status, and abdominal pain. Her husband reports she was in her usual state of health with no recent changes until about 48 hours prior to presentation when she developed diarrhea with loose BMs for several days. She reported abd pain with poor PO intake for about 24 hours prior to presentation. She also developed "gurgling" sounds while breathing, and had increased WOB. Her family was worried and brought to the ED.   Assessment & Plan:   Principal Problem:   Sepsis (HCC) Active Problems:   Acute pulmonary edema (HCC)   Elevated troponin   NSTEMI (non-ST elevated myocardial infarction) (HCC)   Severe sepsis (HCC)   Sepsis due to pneumonia (HCC)   Acute respiratory failure with hypoxia (HCC)   Pulmonary vascular congestion   Thrombocytopenia (HCC)   Dementia   Hypokalemia   Hypomagnesemia   Acute hypoxemic respiratory failure (HCC)   Pulmonary edema   Flash pulmonary edema (HCC)   Severe Sepsis -Most likely secondary to virus but may also be component of bacterial superinfection atypical present. -Treat symptomatically but complete 5 day course of azithromycin  Acute Hypoxic Respiratory Failure/mild respiratory acidosis - Xopenex QID -Mucinex DM BID -Flutter valve -Brovana BID -Sputum pending -Titrate O2 to maintain SPO2>93%  -DC all other antibiotics, continue Azithromycin for 5 day course -Physiotherapy vest BID -Ambulate patient in the hall q shift  NSTEMI - Likely stress induced. No h/o cardiac disease. Takotsubo Cardiomyopathy -Strict in and out since admission - -Daily weight Filed Weights   03/13/16 0410 03/14/16 0429 03/15/16 0500  Weight: 68.5 kg (151 lb  0.2 oz) 69.1 kg (152 lb 5.4 oz) 68.04 kg (150 lb)  -Coreg 3.125 mg BID -Lisinopril 2.5 mg daily -ASA 165mg  PO daily -Lasix 20 mg daily -Spironolactone 12.5 mg daily  Pulmonary vascular congestion -See NSTEMI  GI bleed? -Occult blood, negative  Possible Infectious Diarrhea -C. difficile/stool PCR negative -DC metronidazole and vancomycin  Thrombocytopenia - Mild. -Resolved -See GI bleed?  Hyperglycemia  - No h/o DM. -5/29 Hgb A1c=5.9  Dementia -Aricept 5 mg QHS -Lexapro 10 mg QHS -Neurontin 300 mg QHS  Hypokalemia -Potassium goal> 4 -Potassium IV 50 mEq  Hypomagnesemia -Magnesium goal>2 -Magnesium IV 2 gm  Goals of care -Mr.Cassandra Tanner and his son would like to discuss possibility of transferring patient to [facility] in [place].  They understand that since this would be an elective transfer may incur a cost, and would like to discuss. -6/1Mr.Cassandra Tanner and his son have decided to continue care here at [facility]   DVT prophylaxis: Subcutaneous heparin Code Status: Full Family Communication: Mr.Cassandra Tanner and his son Disposition Plan: Possible transfer to [facility] in [place].   Consultants:  [facility]  Procedures/Significant Events:  CTA Chest 5/28: No PE, no infiltrate. Mild peribronchial thickening. 5/29 TTE: EF 30-35%. Akinesis of anteroseptal & apical myocardium. Grade 1 diastolic dysfunction. Trivial AR. Moderate TR. Findings suggestive of takotsubo cardiomyopathy. RV systolic function normal. No pericardial effusion.  Cultures 5/28 blood left  AC/right hand NGTD  5/28 urine negative final  5/28 MRSA by PCR negative 5/29 respiratory virus panel positive parainfluenza 3  5/29 C. difficile negative 5/29 Stool PCR negative  5/30 sputum pending   Antimicrobials: Rocphin  5/28 x1 Azithromycin 5/28 >> Vanc PO 5/28 - 5/29 Vanc IV 5/28 >> 5/29 Flagyl IV 5/28 >> 5/30 Cefepime 5/28 >> 5/30   Devices    LINES / TUBES:      Continuous Infusions:      Subjective: 6/1 A/O 4, joking with staff and family and physicians.   Objective: Filed Vitals:   03/15/16 1155 03/15/16 1315 03/15/16 1622 03/15/16 1642  BP:  124/93  142/65  Pulse:  82  75  Temp:  98 F (36.7 C)    TempSrc:  Oral    Resp:  22    Height:      Weight:      SpO2: 99% 96% 97%     Intake/Output Summary (Last 24 hours) at 03/15/16 1753 Last data filed at 03/15/16 0912  Gross per 24 hour  Intake    240 ml  Output    875 ml  Net   -635 ml   Filed Weights   03/13/16 0410 03/14/16 0429 03/15/16 0500  Weight: 68.5 kg (151 lb 0.2 oz) 69.1 kg (152 lb 5.4 oz) 68.04 kg (150 lb)    Examination:  General: A/O 4, joking with staff, positive acute respiratory distress Eyes: negative scleral hemorrhage, negative anisocoria, negative icterus ENT: Negative Runny nose, negative gingival bleeding, Neck:  Negative scars, masses, torticollis, lymphadenopathy, JVD Lungs: diffuse bilateral rhonchi and expiratory wheezing (improved)  Cardiovascular: Regular rate and rhythm without murmur gallop or rub normal S1 and S2 Abdomen: negative abdominal pain, nondistended, positive soft, bowel sounds, no rebound, no ascites, no appreciable mass Extremities: No significant cyanosis, clubbing, or edema bilateral lower extremities Skin: Negative rashes, lesions, ulcers Psychiatric:  Negative depression, negative anxiety, negative fatigue, negative mania  Central nervous system:  Cranial nerves II through XII intact, tongue/uvula midline, all extremities muscle strength 5/5, sensation intact throughout, negative dysarthria, negative expressive aphasia, negative receptive aphasia.  .     Data Reviewed: Care during the described time interval was provided by me .  I have reviewed this patient's available data, including medical history, events of note, physical examination, and all test results as part of my evaluation. I have personally reviewed and interpreted all radiology  studies.  CBC:  Recent Labs Lab 03/11/16 1514 03/11/16 2214 03/12/16 0233 03/13/16 0642 03/14/16 0235 03/15/16 0320  WBC 10.9* 13.7* 10.8* 7.8 4.4 8.9  NEUTROABS 9.0*  --   --  4.5  --   --   HGB 15.4* 13.7 12.8 12.0 13.7 13.2  HCT 47.9* 43.2 40.3 37.9 42.3 41.5  MCV 91.1 90.2 90.2 91.3 90.2 88.9  PLT 161 134* 140* 128* 125* 180   Basic Metabolic Panel:  Recent Labs Lab 03/11/16 1514 03/11/16 2214 03/12/16 0233 03/13/16 0642 03/14/16 0235 03/15/16 0320  NA 140  --  140 141 139 141  K 3.5  --  3.7 3.3* 4.3 3.2*  CL 107  --  110 109 108 105  CO2 22  --  21* GLUCOSE 164*  --  141* 86 147* 100*  BUN 17  --  22*  CREATININE 0.76 0.81 0.75 0.76 0.68 0.97  CALCIUM 9.3  --  8.1* 8.4* 8.4* 8.8*  MG  --  1.8 1.7 1.9 2.0 1.9  PHOS  --  3.5 4.0 2.7  --   --    GFR: Estimated Creatinine Clearance: 39.8 mL/min (by C-G formula based on Cr of 0.97). Liver Function Tests:  Recent Labs Lab 03/11/16  1514 03/13/16 0642  AST 25  --   ALT 15  --   ALKPHOS 73  --   BILITOT 0.7  --   PROT 6.9  --   ALBUMIN 3.7 2.8*    Recent Labs Lab 03/11/16 2214  LIPASE 26   No results for input(s): AMMONIA in the last 168 hours. Coagulation Profile: No results for input(s): INR, PROTIME in the last 168 hours. Cardiac Enzymes:  Recent Labs Lab 03/11/16 1723 03/11/16 2050 03/12/16 0233 03/12/16 1135 03/12/16 1755  TROPONINI 0.08* 2.33* 4.00* 3.79* 3.12*   BNP (last 3 results) No results for input(s): PROBNP in the last 8760 hours. HbA1C: No results for input(s): HGBA1C in the last 72 hours. CBG:  Recent Labs Lab 03/13/16 1945 03/13/16 2358 03/14/16 0402 03/14/16 0825 03/14/16 1151  GLUCAP 195* 146* 147* 144* 160*   Lipid Profile: No results for input(s): CHOL, HDL, LDLCALC, TRIG, CHOLHDL, LDLDIRECT in the last 72 hours. Thyroid Function Tests: No results for input(s): TSH, T4TOTAL, FREET4, T3FREE, THYROIDAB in the last 72 hours. Anemia  Panel: No results for input(s): VITAMINB12, FOLATE, FERRITIN, TIBC, IRON, RETICCTPCT in the last 72 hours. Urine analysis:    Component Value Date/Time   COLORURINE YELLOW 03/11/2016 1632   APPEARANCEUR CLOUDY* 03/11/2016 1632   LABSPEC 1.028 03/11/2016 1632   PHURINE 5.5 03/11/2016 1632   GLUCOSEU NEGATIVE 03/11/2016 1632   HGBUR SMALL* 03/11/2016 1632   BILIRUBINUR NEGATIVE 03/11/2016 1632   KETONESUR 15* 03/11/2016 1632   PROTEINUR >300* 03/11/2016 1632   NITRITE NEGATIVE 03/11/2016 1632   LEUKOCYTESUR NEGATIVE 03/11/2016 1632   Sepsis Labs: @LABRCNTIP (procalcitonin:4,lacticidven:4)  ) Recent Results (from the past 240 hour(s))  Culture, blood (Routine x 2)     Status: None (Preliminary result)   Collection Time: 03/11/16  3:14 PM  Result Value Ref Range Status   Specimen Description BLOOD LEFT ANTECUBITAL  Final   Special Requests BOTTLES DRAWN AEROBIC AND ANAEROBIC 5 CC  Final   Culture NO GROWTH 4 DAYS  Final   Report Status PENDING  Incomplete  Culture, blood (Routine x 2)     Status: None (Preliminary result)   Collection Time: 03/11/16  4:13 PM  Result Value Ref Range Status   Specimen Description BLOOD RIGHT HAND  Final   Special Requests IN PEDIATRIC BOTTLE 4 ML  Final   Culture NO GROWTH 4 DAYS  Final   Report Status PENDING  Incomplete  Urine culture     Status: None   Collection Time: 03/11/16  4:32 PM  Result Value Ref Range Status   Specimen Description URINE, CATHETERIZED  Final   Special Requests NONE  Final   Culture NO GROWTH  Final   Report Status 03/12/2016 FINAL  Final  MRSA PCR Screening     Status: None   Collection Time: 03/11/16 11:47 PM  Result Value Ref Range Status   MRSA by PCR NEGATIVE NEGATIVE Final    Comment:        The GeneXpert MRSA Assay (FDA approved for NASAL specimens only), is one component of a comprehensive MRSA colonization surveillance program. It is not intended to diagnose MRSA infection nor to guide or monitor  treatment for MRSA infections.   C difficile quick scan w PCR reflex     Status: None   Collection Time: 03/12/16 10:12 AM  Result Value Ref Range Status   C Diff antigen NEGATIVE NEGATIVE Final   C Diff toxin NEGATIVE NEGATIVE Final   C Diff interpretation Negative  for toxigenic C. difficile  Final  Respiratory Panel by PCR     Status: Abnormal   Collection Time: 03/12/16  2:19 PM  Result Value Ref Range Status   Adenovirus NOT DETECTED NOT DETECTED Final   Coronavirus 229E NOT DETECTED NOT DETECTED Final   Coronavirus HKU1 NOT DETECTED NOT DETECTED Final   Coronavirus NL63 NOT DETECTED NOT DETECTED Final   Coronavirus OC43 NOT DETECTED NOT DETECTED Final   Metapneumovirus NOT DETECTED NOT DETECTED Final   Rhinovirus / Enterovirus NOT DETECTED NOT DETECTED Final   Influenza A NOT DETECTED NOT DETECTED Final   Influenza A H1 NOT DETECTED NOT DETECTED Final   Influenza A H1 2009 NOT DETECTED NOT DETECTED Final   Influenza A H3 NOT DETECTED NOT DETECTED Final   Influenza B NOT DETECTED NOT DETECTED Final   Parainfluenza Virus 1 NOT DETECTED NOT DETECTED Corrected    Comment: CORRECTED ON 05/29 AT 1756: PREVIOUSLY REPORTED AS DETECTED   Parainfluenza Virus 2 NOT DETECTED NOT DETECTED Final   Parainfluenza Virus 3 DETECTED (A) NOT DETECTED Corrected    Comment: CORRECTED ON 05/29 AT 1756: PREVIOUSLY REPORTED AS NOT DETECTED   Parainfluenza Virus 4 NOT DETECTED NOT DETECTED Final   Respiratory Syncytial Virus NOT DETECTED NOT DETECTED Final   Bordetella pertussis NOT DETECTED NOT DETECTED Final   Chlamydophila pneumoniae NOT DETECTED NOT DETECTED Final   Mycoplasma pneumoniae NOT DETECTED NOT DETECTED Final  Gastrointestinal Panel by PCR , Stool     Status: None   Collection Time: 03/12/16  6:36 PM  Result Value Ref Range Status   Campylobacter species NOT DETECTED NOT DETECTED Final   Plesimonas shigelloides NOT DETECTED NOT DETECTED Final   Salmonella species NOT DETECTED NOT  DETECTED Final   Yersinia enterocolitica NOT DETECTED NOT DETECTED Final   Vibrio species NOT DETECTED NOT DETECTED Final   Vibrio cholerae NOT DETECTED NOT DETECTED Final   Enteroaggregative E coli (EAEC) NOT DETECTED NOT DETECTED Final   Enteropathogenic E coli (EPEC) NOT DETECTED NOT DETECTED Final   Enterotoxigenic E coli (ETEC) NOT DETECTED NOT DETECTED Final   Shiga like toxin producing E coli (STEC) NOT DETECTED NOT DETECTED Final   E. coli O157 NOT DETECTED NOT DETECTED Final   Shigella/Enteroinvasive E coli (EIEC) NOT DETECTED NOT DETECTED Final   Cryptosporidium NOT DETECTED NOT DETECTED Final   Cyclospora cayetanensis NOT DETECTED NOT DETECTED Final   Entamoeba histolytica NOT DETECTED NOT DETECTED Final   Giardia lamblia NOT DETECTED NOT DETECTED Final   Adenovirus F40/41 NOT DETECTED NOT DETECTED Final   Astrovirus NOT DETECTED NOT DETECTED Final   Norovirus GI/GII NOT DETECTED NOT DETECTED Final   Rotavirus A NOT DETECTED NOT DETECTED Final   Sapovirus (I, II, IV, and V) NOT DETECTED NOT DETECTED Final         Radiology Studies: No results found.      Scheduled Meds: . arformoterol  15 mcg Nebulization BID  . aspirin EC  162 mg Oral Daily  . azithromycin  500 mg Oral Daily  . carvedilol  3.125 mg Oral BID WC  . donepezil  5 mg Oral QHS  . escitalopram  10 mg Oral QHS  . furosemide  20 mg Oral Daily  . gabapentin  300 mg Oral QHS  . heparin subcutaneous  5,000 Units Subcutaneous Q8H  . ipratropium  0.5 mg Nebulization QID  . levalbuterol  1.25 mg Nebulization QID  . lisinopril  2.5 mg Oral Daily  . spironolactone  12.5 mg Oral Daily   Continuous Infusions:     LOS: 4 days    Time spent: 40 minutes     Helayne Metsker, Roselind Messier, MD [facility] Pager [phone]   If 7PM-7AM, please contact night-coverage [email or web address] Password [email or web address] 03/15/2016, 5:53 PM

## 2016-03-15 NOTE — Progress Notes (Signed)
    Subjective:  Denies CP or dyspnea; still confused; [place] is in [place]   Objective:  Filed Vitals:   03/14/16 2001 03/14/16 2100 03/15/16 0500 03/15/16 0523  BP: 106/55   121/68  Pulse: 66   77  Temp: 98 F (36.7 C)   97.9 F (36.6 C)  TempSrc: Oral   Oral  Resp: 18   16  Height:      Weight:   150 lb (68.04 kg)   SpO2:  96%  94%    Intake/Output from previous day:  Intake/Output Summary (Last 24 hours) at 03/15/16 0754 Last data filed at 03/15/16 0519  Gross per 24 hour  Intake     80 ml  Output   1940 ml  Net  -1860 ml    Physical Exam: Physical exam: Well-developed well-nourished in no acute distress.  Skin is warm and dry.  HEENT is normal.  Neck is supple.  Chest with diffuse exp wheeze Cardiovascular exam is regular rate and rhythm.  Abdominal exam nontender or distended. No masses palpated. Extremities show no edema. neuro grossly intact; oriented to person only    Lab Results: Basic Metabolic Panel:  Recent Labs  16/07/9604/30/17 0642 03/14/16 0235 03/15/16 0320  NA 141 139 141  K 3.3* 4.3 3.2*  CL 109 108 105  CO2 25 22 27   GLUCOSE 86 147* 100*  BUN 15 12 22*  CREATININE 0.76 0.68 0.97  CALCIUM 8.4* 8.4* 8.8*  MG 1.9 2.0 1.9  PHOS 2.7  --   --    CBC:  Recent Labs  03/13/16 0642 03/14/16 0235 03/15/16 0320  WBC 7.8 4.4 8.9  NEUTROABS 4.5  --   --   HGB 12.0 13.7 13.2  HCT 37.9 42.3 41.5  MCV 91.3 90.2 88.9  PLT 128* 125* 180   Cardiac Enzymes:  Recent Labs  03/12/16 1135 03/12/16 1755  TROPONINI 3.79* 3.12*     Assessment/Plan:  1 NSTEMI-Echocardiogram previously reviewed. Patient has anteroseptal and apical hypokinesis suggestive of takotsubo CM. Continue aspirin. Continue carvedilol 3.125 mg twice a day; change captopril to lisinopril 2.5 mg daily. Change lasix to 20 mg daily; add spironolactone to 12.5 mg daily. She presently is not a good candidate for ischemia eval given AMS. We'll plan on repeating echocardiogram  in approximately 3 months and hopefully LV function will have improved if this is indeed a stress cardiomyopathy. 2 Cardiomyopathy-management as outlined under #1. 3 dementia-patient confused last evening and this morning. 4 question sepsis-management per critical care medicine.  Olga MillersBrian Crenshaw 03/15/2016, 7:54 AM

## 2016-03-15 NOTE — Progress Notes (Signed)
Physical Therapy Treatment Patient Details Name: Cassandra Tanner MRN: [id number] DOB: 07/31/1930 Today's Date: 03/15/2016    History of Present Illness This 82 year female, from [place], was visiting for grandson's wedding. She developed worsening dyspnea, AMS and abdominal pain.  She has a h/o dementia    PT Comments    Pt is up to walk with PT noting O2 sat of 94% pregait and 95% postgait with pulses stable at 74 pregait and 81 post gait.  Will need to continue with gait and transfers but with assistance, such as with husband at home.  Follow Up Recommendations  No PT follow up;Supervision for mobility/OOB     Equipment Recommendations  None recommended by PT    Recommendations for Other Services       Precautions / Restrictions Precautions Precautions: Fall Restrictions Weight Bearing Restrictions: No    Mobility  Bed Mobility Overal bed mobility: Needs Assistance Bed Mobility: Supine to Sit;Sit to Supine     Supine to sit: Min guard;Min assist Sit to supine: Min guard      Transfers Overall transfer level: Needs assistance Equipment used: 1 person hand held assist Transfers: Stand Pivot Transfers;Sit to/from Stand Sit to Stand: Min guard Stand pivot transfers: Min guard       General transfer comment: PT available for safety and to assist pt in being steady upon standing  Ambulation/Gait Ambulation/Gait assistance: Min assist;Min guard Ambulation Distance (Feet): 100 Feet Assistive device: 1 person hand held assist Gait Pattern/deviations: Step-through pattern;Step-to pattern;Wide base of support;Trunk flexed;Shuffle Gait velocity: decreased Gait velocity interpretation: Below normal speed for age/gender General Gait Details: pt is not aware of her path but avoids obstacles   Stairs            Wheelchair Mobility    Modified Rankin (Stroke Patients Only)       Balance Overall balance assessment: Needs assistance Sitting-balance support: Feet  supported Sitting balance-Leahy Scale: Fair   Postural control: Posterior lean Standing balance support: Single extremity supported;Bilateral upper extremity supported Standing balance-Leahy Scale: Poor                      Cognition Arousal/Alertness: Awake/alert Behavior During Therapy: WFL for tasks assessed/performed Overall Cognitive Status: History of cognitive impairments - at baseline                      Exercises General Exercises - Lower Extremity Ankle Circles/Pumps: AROM;Both;10 reps Quad Sets: AROM;Both;20 reps Heel Slides: AROM;Both;20 reps Hip ABduction/ADduction: AROM;Both;20 reps    General Comments General comments (skin integrity, edema, etc.): has son and husband in room after PT has nearly finished session.  Her plan is to try steps but was not able today.        Pertinent Vitals/Pain Pain Assessment: No/denies pain    Home Living                      Prior Function            PT Goals (current goals can now be found in the care plan section) Acute Rehab PT Goals Patient Stated Goal: get home per husband Progress towards PT goals: Progressing toward goals    Frequency  Min 3X/week    PT Plan Current plan remains appropriate    Co-evaluation             End of Session Equipment Utilized During Treatment: Gait belt Activity Tolerance: Patient tolerated treatment well  Patient left: in bed;with call bell/phone within reach;with bed alarm set;with family/visitor present (Pt had just changed rooms and nursing did not have report)     Time: 9604-54091500-1525 PT Time Calculation (min) (ACUTE ONLY): 25 min  Charges:  $Gait Training: 8-22 mins $Therapeutic Exercise: 8-22 mins                    G Codes:      Ivar DrapeStout, Aidenjames Heckmann E 03/15/2016, 4:05 PM    Samul Dadauth Shavonn Convey, PT MS Acute Rehab Dept. Number: [facility] [phone] and [facility] [phone]

## 2016-03-16 ENCOUNTER — Inpatient Hospital Stay (HOSPITAL_COMMUNITY): Payer: Medicare (Managed Care)

## 2016-03-16 LAB — MAGNESIUM: MAGNESIUM: 2.1 mg/dL (ref 1.7–2.4)

## 2016-03-16 LAB — BASIC METABOLIC PANEL
Anion gap: 7 (ref 5–15)
BUN: 21 mg/dL — AB (ref 6–20)
CHLORIDE: 107 mmol/L (ref 101–111)
CO2: 26 mmol/L (ref 22–32)
CREATININE: 0.9 mg/dL (ref 0.44–1.00)
Calcium: 9.2 mg/dL (ref 8.9–10.3)
GFR calc Af Amer: >60 mL/min (ref >60)
GFR calc non Af Amer: 58 mL/min — ABNORMAL LOW (ref >60)
GLUCOSE: 108 mg/dL — AB (ref 65–99)
Potassium: 3.4 mmol/L — ABNORMAL LOW (ref 3.5–5.1)
Sodium: 140 mmol/L (ref 135–145)

## 2016-03-16 LAB — POTASSIUM: Potassium: 3.8 mmol/L (ref 3.5–5.1)

## 2016-03-16 LAB — CULTURE, BLOOD (ROUTINE X 2)
Culture: NO GROWTH
Culture: NO GROWTH

## 2016-03-16 MED ORDER — LISINOPRIL 5 MG PO TABS
5.0000 mg | ORAL_TABLET | Freq: Every day | ORAL | Status: DC
  Administered 2016-03-16 – 2016-03-17 (×2): 5 mg via ORAL
  Filled 2016-03-16 (×2): qty 1

## 2016-03-16 MED ORDER — POTASSIUM CHLORIDE CRYS ER 20 MEQ PO TBCR: 40.0000 meq | EXTENDED_RELEASE_TABLET | Freq: Once | ORAL | Status: DC

## 2016-03-16 MED ORDER — CARVEDILOL 6.25 MG PO TABS
6.2500 mg | ORAL_TABLET | Freq: Two times a day (BID) | ORAL | Status: DC
  Administered 2016-03-16 – 2016-03-17 (×3): 6.25 mg via ORAL
  Filled 2016-03-16 (×3): qty 1

## 2016-03-16 MED ORDER — POTASSIUM CHLORIDE CRYS ER 10 MEQ PO TBCR
50.0000 meq | EXTENDED_RELEASE_TABLET | Freq: Once | ORAL | Status: AC
  Administered 2016-03-16: 50 meq via ORAL
  Filled 2016-03-16: qty 1

## 2016-03-16 NOTE — Progress Notes (Addendum)
    Subjective:  Denies CP or dyspnea; confusion better but still does not know year; "congested"   Objective:  Filed Vitals:   03/15/16 2033 03/15/16 2052 03/16/16 0234 03/16/16 0603  BP: 136/74  121/54   Pulse: 75  75   Temp: 98.2 F (36.8 C)  97.9 F (36.6 C)   TempSrc: Oral  Oral   Resp: 20  20   Height:      Weight:    145 lb 8 oz (65.998 kg)  SpO2: 93% 93% 94%     Intake/Output from previous day:  Intake/Output Summary (Last 24 hours) at 03/16/16 0816 Last data filed at 03/16/16 0811  Gross per 24 hour  Intake    360 ml  Output    150 ml  Net    210 ml    Physical Exam: Physical exam: Well-developed well-nourished in no acute distress.  Skin is warm and dry.  HEENT is normal.  Neck is supple.  Chest with diffuse exp wheeze improved Cardiovascular exam is regular rate and rhythm.  Abdominal exam nontender or distended. No masses palpated. Extremities show no edema. neuro grossly intact; oriented to person only    Lab Results: Basic Metabolic Panel:  Recent Labs  96/01/5404/31/17 0235 03/15/16 0320 03/16/16 0334  NA 139 141 140  K 4.3 3.2* 3.4*  CL 108 105 107  CO2 22 27 26   GLUCOSE 147* 100* 108*  BUN 12 22* 21*  CREATININE 0.68 0.97 0.90  CALCIUM 8.4* 8.8* 9.2  MG 2.0 1.9  --    CBC:  Recent Labs  03/14/16 0235 03/15/16 0320  WBC 4.4 8.9  HGB 13.7 13.2  HCT 42.3 41.5  MCV 90.2 88.9  PLT 125* 180    Assessment/Plan:  1 NSTEMI-Echocardiogram previously reviewed. Patient has anteroseptal and apical hypokinesis suggestive of takotsubo CM. Continue aspirin. Increase carvedilol to 6.25 mg twice a day; increase lisinopril to 5 mg daily. Continue lasix 20 mg daily and spironolactone 12.5 mg daily. She presently is not a good candidate for ischemia eval given AMS. We'll plan on repeating echocardiogram in approximately 3 months and hopefully LV function will have improved if this is indeed a stress cardiomyopathy. 2 Cardiomyopathy-management as  outlined under #1. 3 dementia-patient confused last evening and this morning. 4 question sepsis-management per critical care medicine. 5 hypokalemia-supplement Pt can be DCed from a cardiac standpoint Olga MillersBrian Crenshaw 03/16/2016, 8:16 AM

## 2016-03-16 NOTE — Progress Notes (Signed)
Physical Therapy Treatment Patient Details Name: Cassandra Tanner MRN: [id number] DOB: [birth year] Today's Date: 03/16/2016    History of Present Illness This 82 year female, from [place], was visiting for grandson's wedding. She developed worsening dyspnea, AMS and abdominal pain.  She has a h/o dementia    PT Comments    Pt admitted with above diagnosis. Pt currently with functional limitations due to balance and endurance deficits. Pt was able to ambulate and may be close to baseline. Pt with flexed posture and can correct for only seconds at a time and then flexes again.  Pt needed cues to stay close to RW as well.  Decr safety awareness with RW.  Husband did state that pts ambulation is much like baseline.   Pt will benefit from skilled PT to increase their independence and safety with mobility to allow discharge to the venue listed below.    Follow Up Recommendations  Supervision/Assistance - 24 hour;Home health PT     Equipment Recommendations  None recommended by PT    Recommendations for Other Services       Precautions / Restrictions Precautions Precautions: Fall Restrictions Weight Bearing Restrictions: No    Mobility  Bed Mobility Overal bed mobility: Needs Assistance Bed Mobility: Supine to Sit;Sit to Supine     Supine to sit: Min guard;Min assist     General bed mobility comments: pt moved LEs off bed and was able to elevate trunk with just a touch of assist.   Transfers Overall transfer level: Needs assistance Equipment used: Rolling walker (2 wheeled) Transfers: Sit to/from Stand Sit to Stand: Min guard Stand pivot transfers: Min guard       General transfer comment: PT available for safety and to assist pt in being steady upon standing  Ambulation/Gait Ambulation/Gait assistance: Min assist Ambulation Distance (Feet): 150 Feet Assistive device: Rolling walker (2 wheeled) Gait Pattern/deviations: Step-through pattern;Decreased stride length;Trunk  flexed Gait velocity: decreased Gait velocity interpretation: Below normal speed for age/gender General Gait Details: pt is not aware of her path but avoids obstacles   Stairs            Wheelchair Mobility    Modified Rankin (Stroke Patients Only)       Balance Overall balance assessment: Needs assistance Sitting-balance support: No upper extremity supported;Feet supported Sitting balance-Leahy Scale: Fair   Postural control: Posterior lean Standing balance support: Bilateral upper extremity supported;During functional activity Standing balance-Leahy Scale: Poor Standing balance comment: reliant on UEs for support.                    Cognition Arousal/Alertness: Awake/alert Behavior During Therapy: WFL for tasks assessed/performed Overall Cognitive Status: History of cognitive impairments - at baseline                      Exercises      General Comments General comments (skin integrity, edema, etc.): spouse was in the room but went to get coffee.  Sitter in room.       Pertinent Vitals/Pain Pain Assessment: No/denies pain  VSS    Home Living                      Prior Function            PT Goals (current goals can now be found in the care plan section) Progress towards PT goals: Progressing toward goals    Frequency  Min 3X/week    PT Plan Current  plan remains appropriate    Co-evaluation             End of Session Equipment Utilized During Treatment: Gait belt Activity Tolerance: Patient limited by fatigue Patient left: in bed;with call bell/phone within reach;with bed alarm set;with nursing/sitter in room     Time: [id number] PT Time Calculation (min) (ACUTE ONLY): 14 min  Charges:  $Gait Training: 8-22 mins                    G Codes:      WhiteAmadeo Garnet, Aizen Duval F 03/16/2016, 3:17 PM [employer] CorporationDawn Evangaline Jou,PT Acute Rehabilitation [phone])

## 2016-03-16 NOTE — Progress Notes (Addendum)
PROGRESS NOTE    Cassandra Tanner  BJY:[id number] DOB: [birth year] DOA: 03/11/2016 PCP: No primary care provider on file.   Brief Narrative:  Cassandra Tanner is an 82 y/o WF PMHx [place] [facility] when she developed worsening dyspnea, fevers, altered mental status, and abdominal pain. Her husband reports she was in her usual state of health with no recent changes until about 48 hours prior to presentation when she developed diarrhea with loose BMs for several days. She reported abd pain with poor PO intake for about 24 hours prior to presentation. She also developed "gurgling" sounds while breathing, and had increased WOB. Her family was worried and brought to the ED.   Assessment & Plan:   Principal Problem:   Sepsis (HCC) Active Problems:   Acute pulmonary edema (HCC)   Elevated troponin   NSTEMI (non-ST elevated myocardial infarction) (HCC)   Severe sepsis (HCC)   Sepsis due to pneumonia (HCC)   Acute respiratory failure with hypoxia (HCC)   Pulmonary vascular congestion   Thrombocytopenia (HCC)   Dementia   Hypokalemia   Hypomagnesemia   Acute hypoxemic respiratory failure (HCC)   Pulmonary edema   Flash pulmonary edema (HCC)   Severe Sepsis -Most likely secondary to virus but may also be component of bacterial superinfection atypical present. -Treat symptomatically but complete 5 day course of azithromycin  Acute Hypoxic Respiratory Failure/mild respiratory acidosis - Xopenex QID -Mucinex DM BID -Flutter valve -Brovana BID -Sputum pending -Titrate O2 to maintain SPO2>93%  -DC all other antibiotics, continue Azithromycin for 5 day course -Physiotherapy vest BID -Ambulate patient in the hall q shift  NSTEMI - Likely stress induced. No h/o cardiac disease. Takotsubo Cardiomyopathy -Strict in and out since admission -865ml -Daily weight Filed Weights   03/14/16 0429 03/15/16 0500 03/16/16 0603  Weight: 69.1 kg (152 lb  5.4 oz) 68.04 kg (150 lb) 65.998 kg (145 lb 8 oz)  -Coreg 3.125 mg BID -Lisinopril 2.5 mg daily -ASA 165mg  PO daily -Lasix 20 mg daily -Spironolactone 12.5 mg daily  Pulmonary vascular congestion -See NSTEMI  GI bleed? -Occult blood, negative  Possible Infectious Diarrhea -C. difficile/stool PCR negative -DC metronidazole and vancomycin  Thrombocytopenia - Mild. -Resolved -See GI bleed?  Hyperglycemia  - No h/o DM. -5/29 Hgb A1c=5.9  Dementia -Aricept 5 mg QHS -Lexapro 10 mg QHS -Neurontin 300 mg QHS  Hypokalemia -Potassium goal> 4 -Potassium IV 50 mEq -Repeat potassium and magnesium at 1200  Hypomagnesemia -Magnesium goal>2   Goals of care -CassandraTanner and his son would like to discuss possibility of transferring patient to [facility] [place].  They understand that since this would be an elective transfer may incur a cost, and would like to discuss. -6/1Mr.Tanner and his son have decided to [facility] care [facility]   DVT prophylaxis: Subcutaneous heparin Code Status: Full Family Communication: CassandraPavao and his son Disposition Plan: Possible transfer to [facility] [place].   [facility]ants:  [facility]  Procedures/Significant Events:  CTA Chest 5/28: No PE, no infiltrate. Mild peribronchial thickening. 5/29 TTE: EF 30-35%. Akinesis of anteroseptal & apical myocardium. Grade 1 diastolic dysfunction. Trivial AR. Moderate TR. Findings suggestive of takotsubo cardiomyopathy. RV systolic function normal. No pericardial effusion.  Cultures 5/28 blood left  AC/right hand NGTD  5/28 urine negative final  5/28 MRSA by PCR negative 5/29 respiratory virus panel positive parainfluenza 3  5/29 C. difficile negative 5/29 Stool PCR negative  5/30 sputum pending  Antimicrobials: Rocphin 5/28 x1 Azithromycin 5/28 >>  Vanc PO 5/28 - 5/29 Vanc IV 5/28 >> 5/29 Flagyl IV 5/28 >> 5/30 Cefepime 5/28 >> 5/30   Devices    LINES / TUBES:       Continuous Infusions:     Subjective: 6/2 A/O 4, states ready for discharge. Ambulated short distances yesterday.   Objective: Filed Vitals:   03/15/16 2033 03/15/16 2052 03/16/16 0234 03/16/16 0603  BP: 136/74  121/54   Pulse: 75  75   Temp: 98.2 F (36.8 C)  97.9 F (36.6 C)   TempSrc: Oral  Oral   Resp: 20  20   Height:      Weight:    65.998 kg (145 lb 8 oz)  SpO2: 93% 93% 94%     Intake/Output Summary (Last 24 hours) at 03/16/16 0827 Last data filed at 03/16/16 1610  Gross per 24 hour  Intake    120 ml  Output    150 ml  Net    -30 ml   Filed Weights   03/14/16 0429 03/15/16 0500 03/16/16 0603  Weight: 69.1 kg (152 lb 5.4 oz) 68.04 kg (150 lb) 65.998 kg (145 lb 8 oz)    Examination:  General: A/O 4, joking with staff, positive acute respiratory distress Eyes: negative scleral hemorrhage, negative anisocoria, negative icterus ENT: Negative Runny nose, negative gingival bleeding, Neck:  Negative scars, masses, torticollis, lymphadenopathy, JVD Lungs: diffuse bilateral rhonchi and expiratory wheezing (improved)  Cardiovascular: Regular rate and rhythm without murmur gallop or rub normal S1 and S2 Abdomen: negative abdominal pain, nondistended, positive soft, bowel sounds, no rebound, no ascites, no appreciable mass Extremities: No significant cyanosis, clubbing, or edema bilateral lower extremities Skin: Negative rashes, lesions, ulcers Psychiatric:  Negative depression, negative anxiety, negative fatigue, negative mania  Central nervous system:  Cranial nerves II through XII intact, tongue/uvula midline, all extremities muscle strength 5/5, sensation intact throughout, negative dysarthria, negative expressive aphasia, negative receptive aphasia.  .     Data Reviewed: Care during the described time interval was provided by me .  I have reviewed this patient's available data, including medical history, events of note, physical examination, and all  test results as part of my evaluation. I have personally reviewed and interpreted all radiology studies.  CBC:  Recent Labs Lab 03/11/16 1514 03/11/16 2214 03/12/16 0233 03/13/16 0642 03/14/16 0235 03/15/16 0320  WBC 10.9* 13.7* 10.8* 7.8 4.4 8.9  NEUTROABS 9.0*  --   --  4.5  --   --   HGB 15.4* 13.7 12.8 12.0 13.7 13.2  HCT 47.9* 43.2 40.3 37.9 42.3 41.5  MCV 91.1 90.2 90.2 91.3 90.2 88.9  PLT 161 134* 140* 128* 125* 180   Basic Metabolic Panel:  Recent Labs Lab 03/11/16 2214 03/12/16 0233 03/13/16 0642 03/14/16 0235 03/15/16 0320 03/16/16 0334  NA  --  140 141 139 141 140  K  --  3.7 3.3* 4.3 3.2* 3.4*  CL  --  110 109 108 105 107  CO2  --  21* 25 22 27 26   GLUCOSE  --  141* 86 147* 100* 108*  BUN  --  17 15 12  22* 21*  CREATININE 0.81 0.75 0.76 0.68 0.97 0.90  CALCIUM  --  8.1* 8.4* 8.4* 8.8* 9.2  MG 1.8 1.7 1.9 2.0 1.9  --   PHOS 3.5 4.0 2.7  --   --   --    GFR: Estimated Creatinine Clearance: 42.2 mL/min (by C-G formula  based on Cr of 0.9). Liver Function Tests:  Recent Labs Lab 03/11/16 1514 03/13/16 0642  AST 25  --   ALT 15  --   ALKPHOS 73  --   BILITOT 0.7  --   PROT 6.9  --   ALBUMIN 3.7 2.8*    Recent Labs Lab 03/11/16 2214  LIPASE 26   No results for input(s): AMMONIA in the last 168 hours. Coagulation Profile: No results for input(s): INR, PROTIME in the last 168 hours. Cardiac Enzymes:  Recent Labs Lab 03/11/16 1723 03/11/16 2050 03/12/16 0233 03/12/16 1135 03/12/16 1755  TROPONINI 0.08* 2.33* 4.00* 3.79* 3.12*   BNP (last 3 results) No results for input(s): PROBNP in the last 8760 hours. HbA1C: No results for input(s): HGBA1C in the last 72 hours. CBG:  Recent Labs Lab 03/13/16 1945 03/13/16 2358 03/14/16 0402 03/14/16 0825 03/14/16 1151  GLUCAP 195* 146* 147* 144* 160*   Lipid Profile: No results for input(s): CHOL, HDL, LDLCALC, TRIG, CHOLHDL, LDLDIRECT in the last 72 hours. Thyroid Function Tests: No  results for input(s): TSH, T4TOTAL, FREET4, T3FREE, THYROIDAB in the last 72 hours. Anemia Panel: No results for input(s): VITAMINB12, FOLATE, FERRITIN, TIBC, IRON, RETICCTPCT in the last 72 hours. Urine analysis:    Component Value Date/Time   COLORURINE YELLOW 03/11/2016 1632   APPEARANCEUR CLOUDY* 03/11/2016 1632   LABSPEC 1.028 03/11/2016 1632   PHURINE 5.5 03/11/2016 1632   GLUCOSEU NEGATIVE 03/11/2016 1632   HGBUR SMALL* 03/11/2016 1632   BILIRUBINUR NEGATIVE 03/11/2016 1632   KETONESUR 15* 03/11/2016 1632   PROTEINUR >300* 03/11/2016 1632   NITRITE NEGATIVE 03/11/2016 1632   LEUKOCYTESUR NEGATIVE 03/11/2016 1632   Sepsis Labs: @LABRCNTIP (procalcitonin:4,lacticidven:4)  ) Recent Results (from the past 240 hour(s))  Culture, blood (Routine x 2)     Status: None (Preliminary result)   Collection Time: 03/11/16  3:14 PM  Result Value Ref Range Status   Specimen Description BLOOD LEFT ANTECUBITAL  Final   Special Requests BOTTLES DRAWN AEROBIC AND ANAEROBIC 5 CC  Final   Culture NO GROWTH 4 DAYS  Final   Report Status PENDING  Incomplete  Culture, blood (Routine x 2)     Status: None (Preliminary result)   Collection Time: 03/11/16  4:13 PM  Result Value Ref Range Status   Specimen Description BLOOD RIGHT HAND  Final   Special Requests IN PEDIATRIC BOTTLE 4 ML  Final   Culture NO GROWTH 4 DAYS  Final   Report Status PENDING  Incomplete  Urine culture     Status: None   Collection Time: 03/11/16  4:32 PM  Result Value Ref Range Status   Specimen Description URINE, CATHETERIZED  Final   Special Requests NONE  Final   Culture NO GROWTH  Final   Report Status 03/12/2016 FINAL  Final  MRSA PCR Screening     Status: None   Collection Time: 03/11/16 11:47 PM  Result Value Ref Range Status   MRSA by PCR NEGATIVE NEGATIVE Final    Comment:        The GeneXpert MRSA Assay (FDA approved for NASAL specimens only), is one component of a comprehensive MRSA  colonization surveillance program. It is not intended to diagnose MRSA infection nor to guide or monitor treatment for MRSA infections.   C difficile quick scan w PCR reflex     Status: None   Collection Time: 03/12/16 10:12 AM  Result Value Ref Range Status   C Diff antigen NEGATIVE NEGATIVE Final  C Diff toxin NEGATIVE NEGATIVE Final   C Diff interpretation Negative for toxigenic C. difficile  Final  Respiratory Panel by PCR     Status: Abnormal   Collection Time: 03/12/16  2:19 PM  Result Value Ref Range Status   Adenovirus NOT DETECTED NOT DETECTED Final   Coronavirus 229E NOT DETECTED NOT DETECTED Final   Coronavirus HKU1 NOT DETECTED NOT DETECTED Final   Coronavirus NL63 NOT DETECTED NOT DETECTED Final   Coronavirus OC43 NOT DETECTED NOT DETECTED Final   Metapneumovirus NOT DETECTED NOT DETECTED Final   Rhinovirus / Enterovirus NOT DETECTED NOT DETECTED Final   Influenza A NOT DETECTED NOT DETECTED Final   Influenza A H1 NOT DETECTED NOT DETECTED Final   Influenza A H1 2009 NOT DETECTED NOT DETECTED Final   Influenza A H3 NOT DETECTED NOT DETECTED Final   Influenza B NOT DETECTED NOT DETECTED Final   Parainfluenza Virus 1 NOT DETECTED NOT DETECTED Corrected    Comment: CORRECTED ON 05/29 AT 1756: PREVIOUSLY REPORTED AS DETECTED   Parainfluenza Virus 2 NOT DETECTED NOT DETECTED Final   Parainfluenza Virus 3 DETECTED (A) NOT DETECTED Corrected    Comment: CORRECTED ON 05/29 AT 1756: PREVIOUSLY REPORTED AS NOT DETECTED   Parainfluenza Virus 4 NOT DETECTED NOT DETECTED Final   Respiratory Syncytial Virus NOT DETECTED NOT DETECTED Final   Bordetella pertussis NOT DETECTED NOT DETECTED Final   Chlamydophila pneumoniae NOT DETECTED NOT DETECTED Final   Mycoplasma pneumoniae NOT DETECTED NOT DETECTED Final  Gastrointestinal Panel by PCR , Stool     Status: None   Collection Time: 03/12/16  6:36 PM  Result Value Ref Range Status   Campylobacter species NOT DETECTED NOT DETECTED  Final   Plesimonas shigelloides NOT DETECTED NOT DETECTED Final   Salmonella species NOT DETECTED NOT DETECTED Final   Yersinia enterocolitica NOT DETECTED NOT DETECTED Final   Vibrio species NOT DETECTED NOT DETECTED Final   Vibrio cholerae NOT DETECTED NOT DETECTED Final   Enteroaggregative E coli (EAEC) NOT DETECTED NOT DETECTED Final   Enteropathogenic E coli (EPEC) NOT DETECTED NOT DETECTED Final   Enterotoxigenic E coli (ETEC) NOT DETECTED NOT DETECTED Final   Shiga like toxin producing E coli (STEC) NOT DETECTED NOT DETECTED Final   E. coli O157 NOT DETECTED NOT DETECTED Final   Shigella/Enteroinvasive E coli (EIEC) NOT DETECTED NOT DETECTED Final   Cryptosporidium NOT DETECTED NOT DETECTED Final   Cyclospora cayetanensis NOT DETECTED NOT DETECTED Final   Entamoeba histolytica NOT DETECTED NOT DETECTED Final   Giardia lamblia NOT DETECTED NOT DETECTED Final   Adenovirus F40/41 NOT DETECTED NOT DETECTED Final   Astrovirus NOT DETECTED NOT DETECTED Final   Norovirus GI/GII NOT DETECTED NOT DETECTED Final   Rotavirus A NOT DETECTED NOT DETECTED Final   Sapovirus (I, II, IV, and V) NOT DETECTED NOT DETECTED Final         Radiology Studies: Dg Chest Port 1 View  03/16/2016  CLINICAL DATA:  Cough and wheezing. EXAM: PORTABLE CHEST 1 VIEW COMPARISON:  03/13/2016.  03/12/2016. FINDINGS: Cardiomegaly. Diffuse bilateral pulmonary interstitial prominence consistent with interstitial edema and/or pneumonitis again noted. No prominent pleural effusion. No pneumothorax. Prior cervical spine fusion . Prior lumbar spine fusion. IMPRESSION: Cardiomegaly with mild bilateral pulmonary interstitial prominence again noted. Findings consistent with mild interstitial edema and/or pneumonitis. No change from prior exam . Electronically Signed   By: Maisie Fus  Register   On: 03/16/2016 07:43        Scheduled  Meds: . arformoterol  15 mcg Nebulization BID  . aspirin EC  162 mg Oral Daily  .  azithromycin  500 mg Oral Daily  . carvedilol  6.25 mg Oral BID WC  . donepezil  5 mg Oral QHS  . escitalopram  10 mg Oral QHS  . furosemide  20 mg Oral Daily  . gabapentin  300 mg Oral QHS  . heparin subcutaneous  5,000 Units Subcutaneous Q8H  . ipratropium  0.5 mg Nebulization QID  . levalbuterol  1.25 mg Nebulization QID  . lisinopril  5 mg Oral Daily  . potassium chloride      . potassium chloride  40 mEq Oral Once  . spironolactone  12.5 mg Oral Daily   Continuous Infusions:     LOS: 5 days    Time spent: 40 minutes     Davaris Youtsey, Roselind Messier, MD [facility] Pager [phone]   If 7PM-7AM, please contact night-coverage [email or web address] Password [facility] 03/16/2016, 8:27 AM

## 2016-03-16 NOTE — Progress Notes (Signed)
03/16/2016 11:00 AM Pt's. O2 sat on RA resting is 96% HR 79.  Pt. Ambulated 16700ft on RA sats ranged from 96%-100% and HR went as high as 122.  Pt had to take a couple rest stops.  HR did come back down to low 90's once pt got back into bed.   Cassandra Tanner, Cassandra Tanner

## 2016-03-16 NOTE — Care Management Important Message (Signed)
Important Message  Patient Details  Name: Cassandra Tanner MRN: [id number] Date of Birth: 03/26/1930   Medicare Important Message Given:  Yes    Bernadette HoitShoffner, Loudon Krakow Coleman 03/16/2016, 9:26 AM

## 2016-03-17 DIAGNOSIS — I5181 Takotsubo syndrome: Secondary | ICD-10-CM | POA: Diagnosis present

## 2016-03-17 LAB — BASIC METABOLIC PANEL
ANION GAP: 9 (ref 5–15)
BUN: 15 mg/dL (ref 6–20)
CHLORIDE: 107 mmol/L (ref 101–111)
CO2: 23 mmol/L (ref 22–32)
Calcium: 9.5 mg/dL (ref 8.9–10.3)
Creatinine, Ser: 0.85 mg/dL (ref 0.44–1.00)
GFR calc Af Amer: >60 mL/min (ref >60)
GLUCOSE: 134 mg/dL — AB (ref 65–99)
POTASSIUM: 4.1 mmol/L (ref 3.5–5.1)
Sodium: 139 mmol/L (ref 135–145)

## 2016-03-17 LAB — MAGNESIUM: Magnesium: 1.9 mg/dL (ref 1.7–2.4)

## 2016-03-17 MED ORDER — CARVEDILOL 6.25 MG PO TABS: 6.2500 mg | ORAL_TABLET | Freq: Two times a day (BID) | ORAL | Status: AC

## 2016-03-17 MED ORDER — POTASSIUM CHLORIDE CRYS ER 20 MEQ PO TBCR: 20.0000 meq | EXTENDED_RELEASE_TABLET | Freq: Every day | ORAL | Status: AC

## 2016-03-17 MED ORDER — SPIRONOLACTONE 25 MG PO TABS: 12.5000 mg | ORAL_TABLET | Freq: Every day | ORAL | Status: AC

## 2016-03-17 MED ORDER — POTASSIUM CHLORIDE CRYS ER 20 MEQ PO TBCR
20.0000 meq | EXTENDED_RELEASE_TABLET | Freq: Every day | ORAL | Status: DC
  Administered 2016-03-17: 20 meq via ORAL
  Filled 2016-03-17: qty 1

## 2016-03-17 MED ORDER — ALBUTEROL SULFATE (5 MG/ML) 0.5% IN NEBU
2.5000 mg | INHALATION_SOLUTION | Freq: Four times a day (QID) | RESPIRATORY_TRACT | Status: DC | PRN
  Filled 2016-03-17: qty 0.5

## 2016-03-17 MED ORDER — LEVALBUTEROL TARTRATE 45 MCG/ACT IN AERO: 1.0000 | INHALATION_SPRAY | Freq: Four times a day (QID) | RESPIRATORY_TRACT | Status: DC | PRN

## 2016-03-17 MED ORDER — IPRATROPIUM BROMIDE 0.02 % IN SOLN
RESPIRATORY_TRACT | Status: AC
  Filled 2016-03-17: qty 2.5

## 2016-03-17 MED ORDER — LISINOPRIL 5 MG PO TABS: 5.0000 mg | ORAL_TABLET | Freq: Every day | ORAL | Status: AC

## 2016-03-17 MED ORDER — LEVALBUTEROL HCL 1.25 MG/0.5ML IN NEBU
INHALATION_SOLUTION | RESPIRATORY_TRACT | Status: AC
  Filled 2016-03-17: qty 0.5

## 2016-03-17 MED ORDER — FUROSEMIDE 20 MG PO TABS: 20.0000 mg | ORAL_TABLET | Freq: Every day | ORAL | Status: AC

## 2016-03-17 NOTE — Care Management Note (Addendum)
Case Management Note  Patient Details  Name: Willette Bracehyllis Gamboa MRN: [id number] Date of Birth: 1930/09/03  Subjective/Objective:      Severe Sepsis, NSTEMI              Action/Plan: Discharge Planning: AVS reviewed:  NCM spoke to pt and she lives in [place] and plan is to return. Explained to pt's Unit RN that she can follow up with her PCP in [place] to arrange [facility]. Spoke to pt's dtr, Debbrah AlarGeralyn Tallie and states her brother, Idalia NeedleMatthew Thurmond # [phone] will pick up pt today. Spoke to son, Idalia NeedleMatthew Sloane. Pt's PCP, [facility], [facility] # [phone].   Expected Discharge Date:  03/17/2016              Expected Discharge Plan:  Home/Self Care  In-House Referral:  NA  Discharge planning Services  CM Consult  Post Acute Care Choice:  NA Choice offered to:  NA  DME Arranged:  N/A DME Agency:  NA  HH Arranged:  NA HH Agency:  NA  Status of Service:  Completed, signed off  Medicare Important Message Given:  Yes Date Medicare IM Given:    Medicare IM give by:    Date Additional Medicare IM Given:    Additional Medicare Important Message give by:     If discussed at Long Length of Stay Meetings, dates discussed:    Additional Comments:  Elliot CousinShavis, Tashe Purdon Ellen, RN 03/17/2016, 12:15 PM

## 2016-03-17 NOTE — Progress Notes (Signed)
    Subjective:   82 y/[place] when she developed worsening dyspnea, fevers, altered mental status, and abdominal pain  No dyspnea. No CP Answers questions . Still does not know th year, thought it was 2012.    Objective:  Filed Vitals:   03/17/16 0247 03/17/16 0443 03/17/16 0810 03/17/16 0927  BP:  123/90  123/54  Pulse:  66  65  Temp:  97.3 F (36.3 C)    TempSrc:  Oral    Resp:  18    Height:      Weight: 150 lb 9.6 oz (68.312 kg)     SpO2:  97% 98%     Intake/Output from previous day:  Intake/Output Summary (Last 24 hours) at 03/17/16 0939 Last data filed at 03/17/16 0854  Gross per 24 hour  Intake    500 ml  Output   1579 ml  Net  -1079 ml    Physical Exam: Physical exam: Well-developed well-nourished in no acute distress.  Skin is warm and dry.  HEENT is normal.  Neck is supple.  Chest with diffuse exp wheeze improved Cardiovascular exam is regular rate and rhythm.  Abdominal exam nontender or distended. No masses palpated. Extremities show no edema. neuro grossly intact; oriented to person only    Lab Results: Basic Metabolic Panel:  Recent Labs  40/98/1104/10/31 0320 03/16/16 0334 03/16/16 1143  NA 141 140  --   K 3.2* 3.4* 3.8  CL 105 107  --   CO2 27 26  --   GLUCOSE 100* 108*  --   BUN 22* 21*  --   CREATININE 0.97 0.90  --   CALCIUM 8.8* 9.2  --   MG 1.9  --  2.1   CBC:  Recent Labs  03/15/16 0320  WBC 8.9  HGB 13.2  HCT 41.5  MCV 88.9  PLT 180    Assessment/Plan:  1 NSTEMI-Echocardiogram previously reviewed. Patient has anteroseptal and apical hypokinesis suggestive of takotsubo CM. Continue aspirin.  On  carvedilol  6.25 mg twice a day; increase lisinopril to 5 mg daily. Continue lasix 20 mg daily and spironolactone 12.5 mg daily. She presently is not a good candidate for ischemia eval given AMS. We'll plan on repeating echocardiogram in approximately 3 months and  hopefully LV function will have improved if this is indeed a stress cardiomyopathy. No additional cardiology recs.     2 Cardiomyopathy-management as outlined under #1.  3 dementia-patient confused last evening and this morning.  4 question sepsis-management per Int. Med.  5 hypokalemia-supplement  Pt can be DCed from a cardiac standpoint Will sign off. Call for questions    Kristeen MissPhilip Eura Radabaugh, MD  03/17/2016 9:44 AM    [facility] [address],  [address] [place], [place]  [id number] Pager [phone] Phone: [phone]; Fax: [phone]

## 2016-03-17 NOTE — Discharge Summary (Signed)
Physician Discharge Summary  Cassandra Tanner ZOX:[id number] DOB: 17-Mar-1930 DOA: 03/11/2016  PCP: No primary care provider on file.  Admit date: 03/11/2016 Discharge date: 03/17/2016  Time spent: 35 minutes  Recommendations for Outpatient Follow-up: Severe Sepsis -Most likely secondary to virus but may also be component of bacterial superinfection atypical present. -Completed 5 day course of azithromycin  Acute Hypoxic Respiratory Failure/mild respiratory acidosis - Xopenex QID PRN -Mucinex DM BID -Flutter valve  NSTEMI - Likely stress induced. No h/o cardiac disease. Takotsubo Cardiomyopathy -Strict in and out since admission - -Daily weight Filed Weights   03/15/16 0500 03/16/16 0603 03/17/16 0247  Weight: 68.04 kg (150 lb) 65.998 kg (145 lb 8 oz) 68.312 kg (150 lb 9.6 oz)  -Coreg 6.25 mg BID -Lisinopril 5 mg daily -ASA 81 mg 2 tablets PO daily -Lasix 20 mg daily -Spironolactone 12.5 mg daily -Family counseled that base weight is 150 pounds. Patient will need to weigh yourself daily and record in a journal. -Patient counseled she will need to follow-up with her PCP, to monitor electrolytes as well as daily weight. In addition PCP will need to refer her to a cardiologist. Patient lives in [place] and is only visiting [place].  Pulmonary vascular congestion -See NSTEMI  GI bleed? -Occult blood, negative  Possible Infectious Diarrhea -C. difficile/stool PCR negative  Thrombocytopenia - Mild. -Resolved -See GI bleed  Hyperglycemia  - No h/o DM. -5/29 Hgb A1c=5.9  Dementia -Aricept 5 mg QHS -Lexapro 10 mg QHS -Neurontin 300 mg QHS  Hypokalemia -Potassium goal> 4 -K Dur 20 mEq daily  Hypomagnesemia -Magnesium goal>2   Discharge Diagnoses:  Principal Problem:   Sepsis (HCC) Active Problems:   Acute pulmonary edema (HCC)   Elevated troponin   NSTEMI (non-ST elevated myocardial infarction) (HCC)   Severe sepsis (HCC)   Sepsis due to pneumonia  (HCC)   Acute respiratory failure with hypoxia (HCC)   Pulmonary vascular congestion   Thrombocytopenia (HCC)   Dementia   Hypokalemia   Hypomagnesemia   Acute hypoxemic respiratory failure (HCC)   Pulmonary edema   Flash pulmonary edema (HCC)   Takotsubo cardiomyopathy   Discharge Condition: Stable  Diet recommendation: Heart healthy  Filed Weights   03/15/16 0500 03/16/16 0603 03/17/16 0247  Weight: 68.04 kg (150 lb) 65.998 kg (145 lb 8 oz) 68.312 kg (150 lb 9.6 oz)    History of present illness:  Cassandra Tanner is an 82 y/o WF PMHx Unk? From [place] who was visiting [facility] for her grandson's wedding when she developed worsening dyspnea, fevers, altered mental status, and abdominal pain. Her husband reports she was in her usual state of health with no recent changes until about 48 hours prior to presentation when she developed diarrhea with loose BMs for several days. She reported abd pain with poor PO intake for about 24 hours prior to presentation. She also developed "gurgling" sounds while breathing, and had increased WOB. Her family was worried and brought to the ED During his hospitalization patient was treated for severe sepsis secondary to viral pneumonia resulting in respiratory failure with hypoxia. Patient's hospitalization was Complicated by NSTEMI/Takotsubo Cardiomyopathy. Patient's bottle pneumonia was treated symptomatically and patient was started on appropriate CHF medication. Patient is to monitor down visiting family and understands that upon her return to [place] she will need to have her PCP arrange for a follow-up with a cardiologist. Recommend at 3 months cardiologist repeat echocardiogram to determine if patient has improvement in cardiac function.   Consultants:  Dr.Jennings Harlon Ditty [facility] M Dr. Lewayne BuntingBrian S [facility]   Procedures/Significant Events:  CTA Chest 5/28: No PE, no infiltrate. Mild peribronchial thickening. 5/29 TTE: EF 30-35%. Akinesis of  anteroseptal & apical myocardium. Grade 1 diastolic dysfunction. Trivial AR. Moderate TR. Findings suggestive of takotsubo cardiomyopathy. RV systolic function normal. No pericardial effusion.  Cultures 5/28 blood left AC/right hand NGTD  5/28 urine negative final  5/28 MRSA by PCR negative 5/29 respiratory virus panel positive parainfluenza 3  5/29 C. difficile negative 5/29 Stool PCR negative  5/30 sputum pending   Antimicrobials: Rocphin 5/28 x1 Azithromycin 5/28 >> 6/3 Vanc PO 5/28 - 5/29 Vanc IV 5/28 >> 5/29 Flagyl IV 5/28 >> 5/30 Cefepime 5/28 >> 5/30    Discharge Exam: Filed Vitals:   03/17/16 0247 03/17/16 0443 03/17/16 0810 03/17/16 0927  BP:  123/90  123/54  Pulse:  66  65  Temp:  97.3 F (36.3 C)    TempSrc:  Oral    Resp:  18    Height:      Weight: 68.312 kg (150 lb 9.6 oz)     SpO2:  97% 98%     General: A/O 4, negative acute respiratory distress Eyes: negative scleral hemorrhage, negative anisocoria, negative icterus ENT: Negative Runny nose, negative gingival bleeding, Neck: Negative scars, masses, torticollis, lymphadenopathy, JVD Lungs: diffuse bilateral rhonchi and expiratory wheezing (improved)  Cardiovascular: Regular rate and rhythm without murmur gallop or rub normal S1 and S2    Discharge Instructions     Medication List    TAKE these medications        carvedilol 6.25 MG tablet  Commonly known as:  COREG  Take 1 tablet (6.25 mg total) by mouth 2 (two) times daily with a meal.     donepezil 5 MG tablet  Commonly known as:  ARICEPT  Take 5 mg by mouth at bedtime.     escitalopram 10 MG tablet  Commonly known as:  LEXAPRO  Take 10 mg by mouth at bedtime.     furosemide 20 MG tablet  Commonly known as:  LASIX  Take 1 tablet (20 mg total) by mouth daily.     gabapentin 300 MG capsule  Commonly known as:  NEURONTIN  Take 300 mg by mouth at bedtime.     lisinopril 5 MG tablet  Commonly known as:  PRINIVIL,ZESTRIL   Take 1 tablet (5 mg total) by mouth daily.     potassium chloride SA 20 MEQ tablet  Commonly known as:  K-DUR,KLOR-CON  Take 1 tablet (20 mEq total) by mouth daily.     spironolactone 25 MG tablet  Commonly known as:  ALDACTONE  Take 0.5 tablets (12.5 mg total) by mouth daily.     vitamin C 500 MG tablet  Commonly known as:  ASCORBIC ACID  Take 500 mg by mouth every morning.     Vitamin D-3 1000 units Caps  Take 1,000 Units by mouth every morning.       No Known Allergies    The results of significant diagnostics from this hospitalization (including imaging, microbiology, ancillary and laboratory) are listed below for reference.    Significant Diagnostic Studies: Dg Chest 2 View  03/11/2016  CLINICAL DATA:  possible sepsis EXAM: CHEST  2 VIEW COMPARISON:  None. FINDINGS: Lumbar spine fixation. Cervical spine fixation. Midline trachea. Borderline cardiomegaly. Atherosclerosis in the transverse aorta. No pleural effusion or pneumothorax. Mild biapical pleural thickening. Diffuse pulmonary interstitial thickening. Suspicion of more focal inferior right  upper lobe patchy airspace disease. IMPRESSION: Possible posterior right upper lobe airspace disease. This is superimposed upon presumably chronic nonspecific interstitial thickening. Depending on clinical symptoms, consider radiographic follow-up in 2-4 days. Borderline cardiomegaly with aortic atherosclerosis. Electronically Signed   By: Jeronimo Greaves M.D.   On: 03/11/2016 15:54   Ct Angio Chest Pe W/cm &/or Wo Cm  03/11/2016  CLINICAL DATA:  Acute onset of congestion, cough, shortness of breath and fever. Initial encounter. EXAM: CT ANGIOGRAPHY CHEST WITH CONTRAST TECHNIQUE: Multidetector CT imaging of the chest was performed using the standard protocol during bolus administration of intravenous contrast. Multiplanar CT image reconstructions and MIPs were obtained to evaluate the vascular anatomy. CONTRAST:  100 mL of Isovue 300 IV  contrast COMPARISON:  Chest radiograph performed earlier today at 5:22 p.m. FINDINGS: There is no evidence of pulmonary embolus. Minimal bilateral atelectasis is noted. The lungs are otherwise clear. A prominent bleb is noted at the left upper lobe. There is no evidence of significant focal consolidation, pleural effusion or pneumothorax. No masses are identified; no abnormal focal contrast enhancement is seen. Mild peribronchial thickening is noted, without a dominant mass. The mediastinum is grossly unremarkable in appearance. Visualized mediastinal nodes remain normal in size. Scattered calcification is seen along the aortic arch and descending thoracic aorta. The great vessels are grossly unremarkable in appearance. No axillary lymphadenopathy is seen. The visualized portions of the thyroid gland are unremarkable in appearance. The visualized portions of the liver and spleen are unremarkable. The patient is status post cholecystectomy, with clips noted at the gallbladder fossa. The visualized portions of the pancreas, adrenal glands and right kidney are grossly unremarkable. No acute osseous abnormalities are seen. Lumbar spinal fusion hardware is partially imaged, somewhat superiorly angulated. Multilevel vacuum phenomenon is noted along the lower thoracic spine. Review of the MIP images confirms the above findings. IMPRESSION: 1. No evidence of pulmonary embolus. 2. Minimal bilateral atelectasis noted. Lungs otherwise clear. Prominent bleb at the left upper lobe. 3. Mild peribronchial thickening noted. 4. Scattered calcification along the aortic arch and descending thoracic aorta. 5. Degenerative change along the lower thoracic spine. Electronically Signed   By: Roanna Raider M.D.   On: 03/11/2016 19:04   Dg Chest Port 1 View  03/16/2016  CLINICAL DATA:  Cough and wheezing. EXAM: PORTABLE CHEST 1 VIEW COMPARISON:  03/13/2016.  03/12/2016. FINDINGS: Cardiomegaly. Diffuse bilateral pulmonary interstitial  prominence consistent with interstitial edema and/or pneumonitis again noted. No prominent pleural effusion. No pneumothorax. Prior cervical spine fusion . Prior lumbar spine fusion. IMPRESSION: Cardiomegaly with mild bilateral pulmonary interstitial prominence again noted. Findings consistent with mild interstitial edema and/or pneumonitis. No change from prior exam . Electronically Signed   By: Maisie Fus  Register   On: 03/16/2016 07:43   Dg Chest Port 1 View  03/13/2016  CLINICAL DATA:  Pulmonary edema. Shortness of breath today. History of dementia, Parkinson's disease. EXAM: PORTABLE CHEST 1 VIEW COMPARISON:  Chest x-rays dated 03/12/2016 and 03/11/2016. FINDINGS: Cardiomegaly is stable. Overall cardiomediastinal silhouette is stable in size and configuration. Atherosclerotic changes again noted at the aortic arch. The central pulmonary vascular congestion and perihilar edema is not significantly changed. No new lung findings. No pleural effusion or pneumothorax seen. Osseous structures about the chest are unremarkable. IMPRESSION: No significant change compared to yesterday's chest x-ray. Continued evidence of CHF/volume overload with central pulmonary vascular congestion and bilateral perihilar edema. Electronically Signed   By: Bary Richard M.D.   On: 03/13/2016 16:45   Dg  Chest Port 1 View  03/12/2016  CLINICAL DATA:  Acute respiratory failure, cough. EXAM: PORTABLE CHEST 1 VIEW COMPARISON:  Mar 11, 2016. FINDINGS: Stable cardiomediastinal silhouette. Stable central pulmonary vascular congestion is noted. Stable bilateral perihilar edema is noted. No pneumothorax or pleural effusion is noted. Bony thorax is unremarkable. IMPRESSION: Stable central pulmonary vascular congestion is noted with probable bilateral perihilar edema. Electronically Signed   By: Lupita Raider, M.D.   On: 03/12/2016 20:38   Dg Chest Portable 1 View  03/11/2016  CLINICAL DATA:  Shortness of Breath EXAM: PORTABLE CHEST 1 VIEW  COMPARISON:  03/11/2016 FINDINGS: Cardiac shadow is stable. The lungs are again well aerated. Increasing vascular congestion with new mild interstitial edema is noted. There is some improved aeration in the right upper lobe when compared with the prior exam. Postsurgical changes are again seen. IMPRESSION: Increased vascular congestion with interstitial edema. Electronically Signed   By: Alcide Clever M.D.   On: 03/11/2016 17:40    Microbiology: Recent Results (from the past 240 hour(s))  Culture, blood (Routine x 2)     Status: None   Collection Time: 03/11/16  3:14 PM  Result Value Ref Range Status   Specimen Description BLOOD LEFT ANTECUBITAL  Final   Special Requests BOTTLES DRAWN AEROBIC AND ANAEROBIC 5 CC  Final   Culture NO GROWTH 5 DAYS  Final   Report Status 03/16/2016 FINAL  Final  Culture, blood (Routine x 2)     Status: None   Collection Time: 03/11/16  4:13 PM  Result Value Ref Range Status   Specimen Description BLOOD RIGHT HAND  Final   Special Requests IN PEDIATRIC BOTTLE 4 ML  Final   Culture NO GROWTH 5 DAYS  Final   Report Status 03/16/2016 FINAL  Final  Urine culture     Status: None   Collection Time: 03/11/16  4:32 PM  Result Value Ref Range Status   Specimen Description URINE, CATHETERIZED  Final   Special Requests NONE  Final   Culture NO GROWTH  Final   Report Status 03/12/2016 FINAL  Final  MRSA PCR Screening     Status: None   Collection Time: 03/11/16 11:47 PM  Result Value Ref Range Status   MRSA by PCR NEGATIVE NEGATIVE Final    Comment:        The GeneXpert MRSA Assay (FDA approved for NASAL specimens only), is one component of a comprehensive MRSA colonization surveillance program. It is not intended to diagnose MRSA infection nor to guide or monitor treatment for MRSA infections.   C difficile quick scan w PCR reflex     Status: None   Collection Time: 03/12/16 10:12 AM  Result Value Ref Range Status   C Diff antigen NEGATIVE NEGATIVE Final    C Diff toxin NEGATIVE NEGATIVE Final   C Diff interpretation Negative for toxigenic C. difficile  Final  Respiratory Panel by PCR     Status: Abnormal   Collection Time: 03/12/16  2:19 PM  Result Value Ref Range Status   Adenovirus NOT DETECTED NOT DETECTED Final   Coronavirus 229E NOT DETECTED NOT DETECTED Final   Coronavirus HKU1 NOT DETECTED NOT DETECTED Final   Coronavirus NL63 NOT DETECTED NOT DETECTED Final   Coronavirus OC43 NOT DETECTED NOT DETECTED Final   Metapneumovirus NOT DETECTED NOT DETECTED Final   Rhinovirus / Enterovirus NOT DETECTED NOT DETECTED Final   Influenza A NOT DETECTED NOT DETECTED Final   Influenza A H1 NOT DETECTED NOT  DETECTED Final   Influenza A H1 2009 NOT DETECTED NOT DETECTED Final   Influenza A H3 NOT DETECTED NOT DETECTED Final   Influenza B NOT DETECTED NOT DETECTED Final   Parainfluenza Virus 1 NOT DETECTED NOT DETECTED Corrected    Comment: CORRECTED ON 05/29 AT 1756: PREVIOUSLY REPORTED AS DETECTED   Parainfluenza Virus 2 NOT DETECTED NOT DETECTED Final   Parainfluenza Virus 3 DETECTED (A) NOT DETECTED Corrected    Comment: CORRECTED ON 05/29 AT 1756: PREVIOUSLY REPORTED AS NOT DETECTED   Parainfluenza Virus 4 NOT DETECTED NOT DETECTED Final   Respiratory Syncytial Virus NOT DETECTED NOT DETECTED Final   Bordetella pertussis NOT DETECTED NOT DETECTED Final   Chlamydophila pneumoniae NOT DETECTED NOT DETECTED Final   Mycoplasma pneumoniae NOT DETECTED NOT DETECTED Final  Gastrointestinal Panel by PCR , Stool     Status: None   Collection Time: 03/12/16  6:36 PM  Result Value Ref Range Status   Campylobacter species NOT DETECTED NOT DETECTED Final   Plesimonas shigelloides NOT DETECTED NOT DETECTED Final   Salmonella species NOT DETECTED NOT DETECTED Final   Yersinia enterocolitica NOT DETECTED NOT DETECTED Final   Vibrio species NOT DETECTED NOT DETECTED Final   Vibrio cholerae NOT DETECTED NOT DETECTED Final   Enteroaggregative E coli  (EAEC) NOT DETECTED NOT DETECTED Final   Enteropathogenic E coli (EPEC) NOT DETECTED NOT DETECTED Final   Enterotoxigenic E coli (ETEC) NOT DETECTED NOT DETECTED Final   Shiga like toxin producing E coli (STEC) NOT DETECTED NOT DETECTED Final   E. coli O157 NOT DETECTED NOT DETECTED Final   Shigella/Enteroinvasive E coli (EIEC) NOT DETECTED NOT DETECTED Final   Cryptosporidium NOT DETECTED NOT DETECTED Final   Cyclospora cayetanensis NOT DETECTED NOT DETECTED Final   Entamoeba histolytica NOT DETECTED NOT DETECTED Final   Giardia lamblia NOT DETECTED NOT DETECTED Final   Adenovirus F40/41 NOT DETECTED NOT DETECTED Final   Astrovirus NOT DETECTED NOT DETECTED Final   Norovirus GI/GII NOT DETECTED NOT DETECTED Final   Rotavirus A NOT DETECTED NOT DETECTED Final   Sapovirus (I, II, IV, and V) NOT DETECTED NOT DETECTED Final     Labs: Basic Metabolic Panel:  Recent Labs Lab 03/11/16 2214 03/12/16 0233 03/13/16 1610 03/14/16 0235 03/15/16 0320 03/16/16 0334 03/16/16 1143 03/17/16 1008  NA  --  140 141 139 141 140  --  139  K  --  3.7 3.3* 4.3 3.2* 3.4* 3.8 4.1  CL  --  110 109 108 105 107  --  107  CO2  --  21* 25 22 27 26   --  23  GLUCOSE  --  141* 86 147* 100* 108*  --  134*  BUN  --  17 15 12  22* 21*  --  15  CREATININE 0.81 0.75 0.76 0.68 0.97 0.90  --  0.85  CALCIUM  --  8.1* 8.4* 8.4* 8.8* 9.2  --  9.5  MG 1.8 1.7 1.9 2.0 1.9  --  2.1 1.9  PHOS 3.5 4.0 2.7  --   --   --   --   --    Liver Function Tests:  Recent Labs Lab 03/11/16 1514 03/13/16 0642  AST 25  --   ALT 15  --   ALKPHOS 73  --   BILITOT 0.7  --   PROT 6.9  --   ALBUMIN 3.7 2.8*    Recent Labs Lab 03/11/16 2214  LIPASE 26   No results for  input(s): AMMONIA in the last 168 hours. CBC:  Recent Labs Lab 03/11/16 1514 03/11/16 2214 03/12/16 0233 03/13/16 0642 03/14/16 0235 03/15/16 0320  WBC 10.9* 13.7* 10.8* 7.8 4.4 8.9  NEUTROABS 9.0*  --   --  4.5  --   --   HGB 15.4* 13.7 12.8 12.0  13.7 13.2  HCT 47.9* 43.2 40.3 37.9 42.3 41.5  MCV 91.1 90.2 90.2 91.3 90.2 88.9  PLT 161 134* 140* 128* 125* 180   Cardiac Enzymes:  Recent Labs Lab 03/11/16 1723 03/11/16 2050 03/12/16 0233 03/12/16 1135 03/12/16 1755  TROPONINI 0.08* 2.33* 4.00* 3.79* 3.12*   BNP: BNP (last 3 results)  Recent Labs  03/11/16 1722  BNP 107.7*    ProBNP (last 3 results) No results for input(s): PROBNP in the last 8760 hours.  CBG:  Recent Labs Lab 03/13/16 1945 03/13/16 2358 03/14/16 0402 03/14/16 0825 03/14/16 1151  GLUCAP 195* 146* 147* 144* 160*       Signed:  Carolyne Littles, MD [facility] [phone] pager

## 2016-03-21 LAB — LACTOFERRIN, FECAL, QUANT.: Lactoferrin, Fecal, Quant.: 3.03 ug/mL(g) (ref 0.00–7.24)

## 2017-10-31 IMAGING — DX DG CHEST 2V
2 series · 2 of 2 positions shown · non-contrast
Comparison: None.

CLINICAL DATA: possible sepsis

EXAM:
CHEST  2 VIEW

[chest pa]
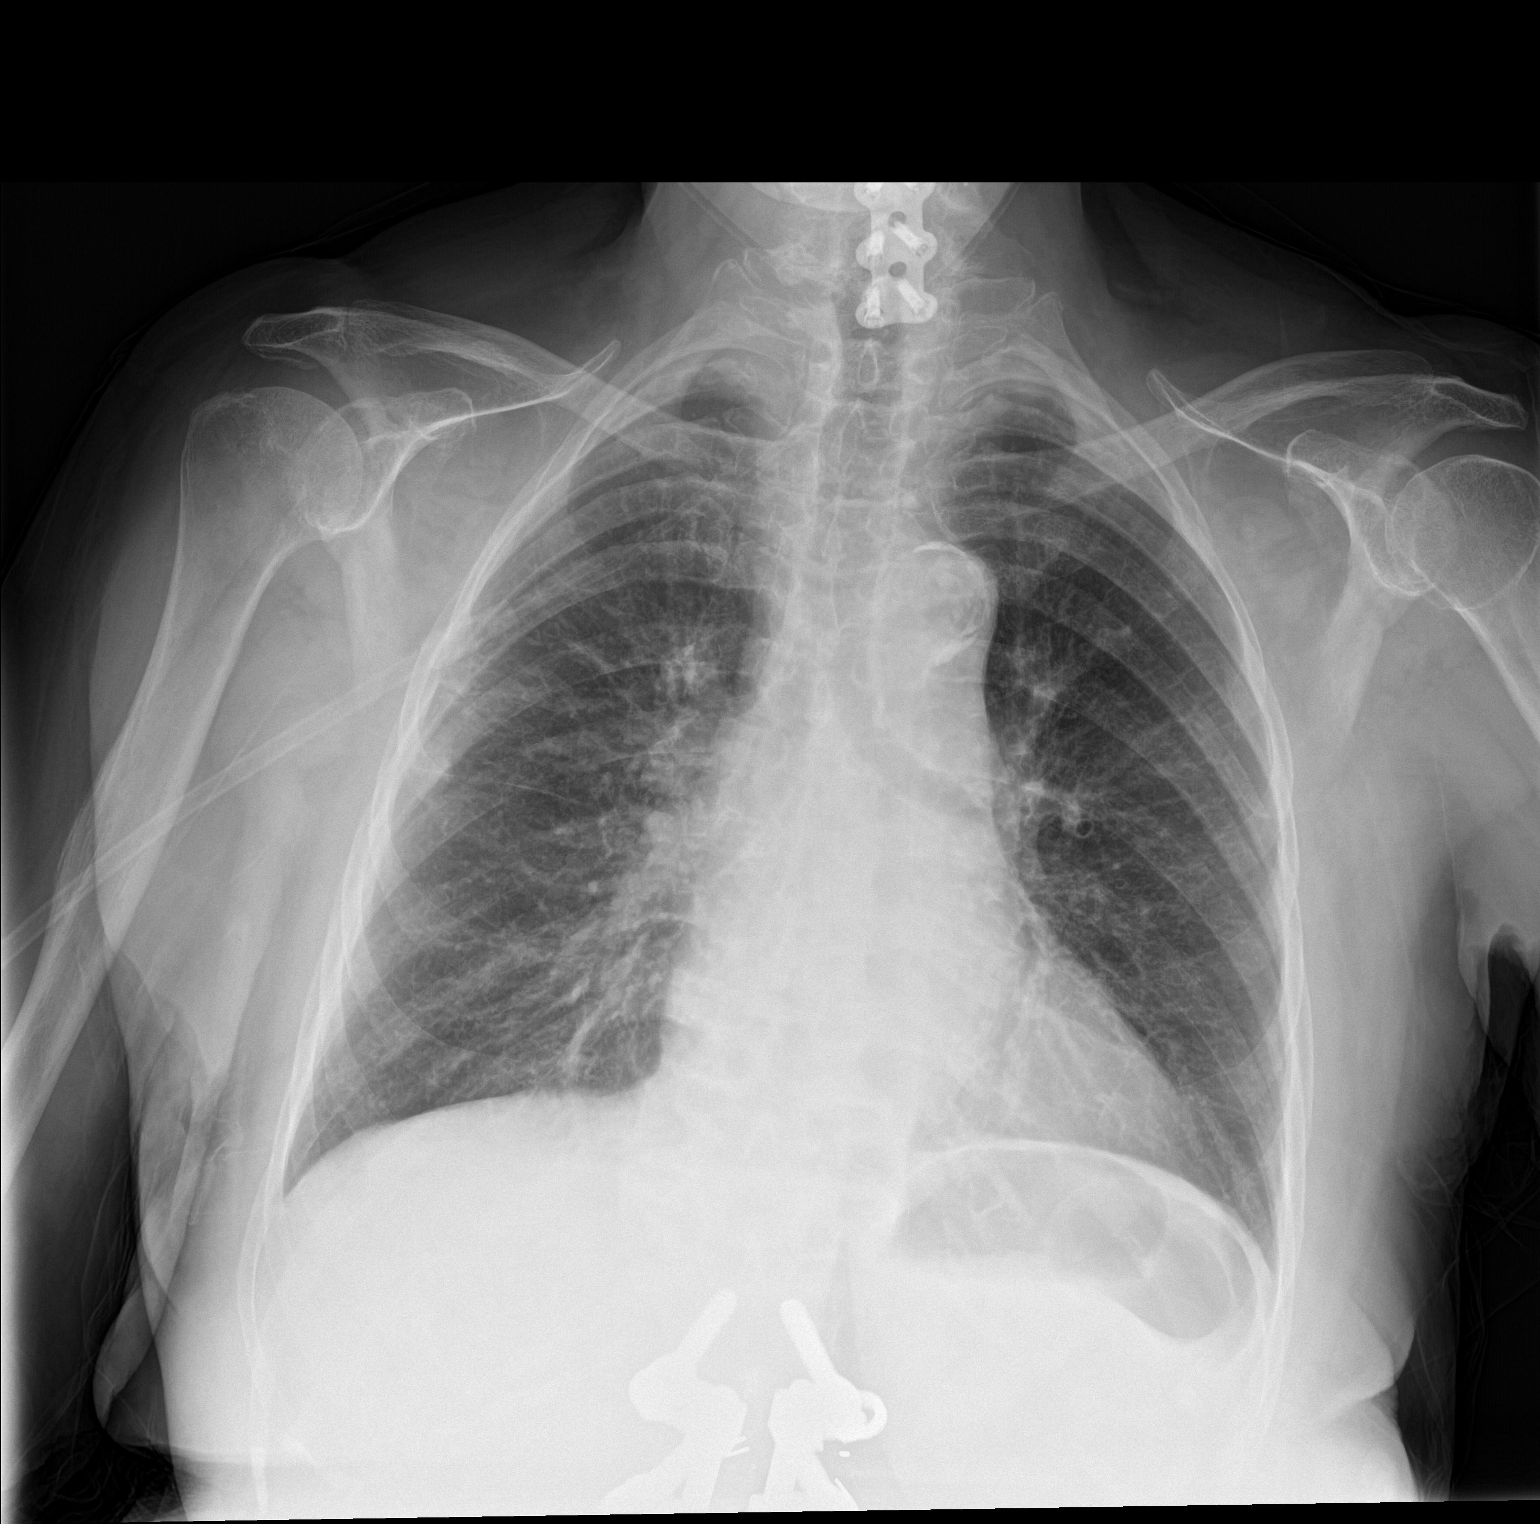

[chest lat]
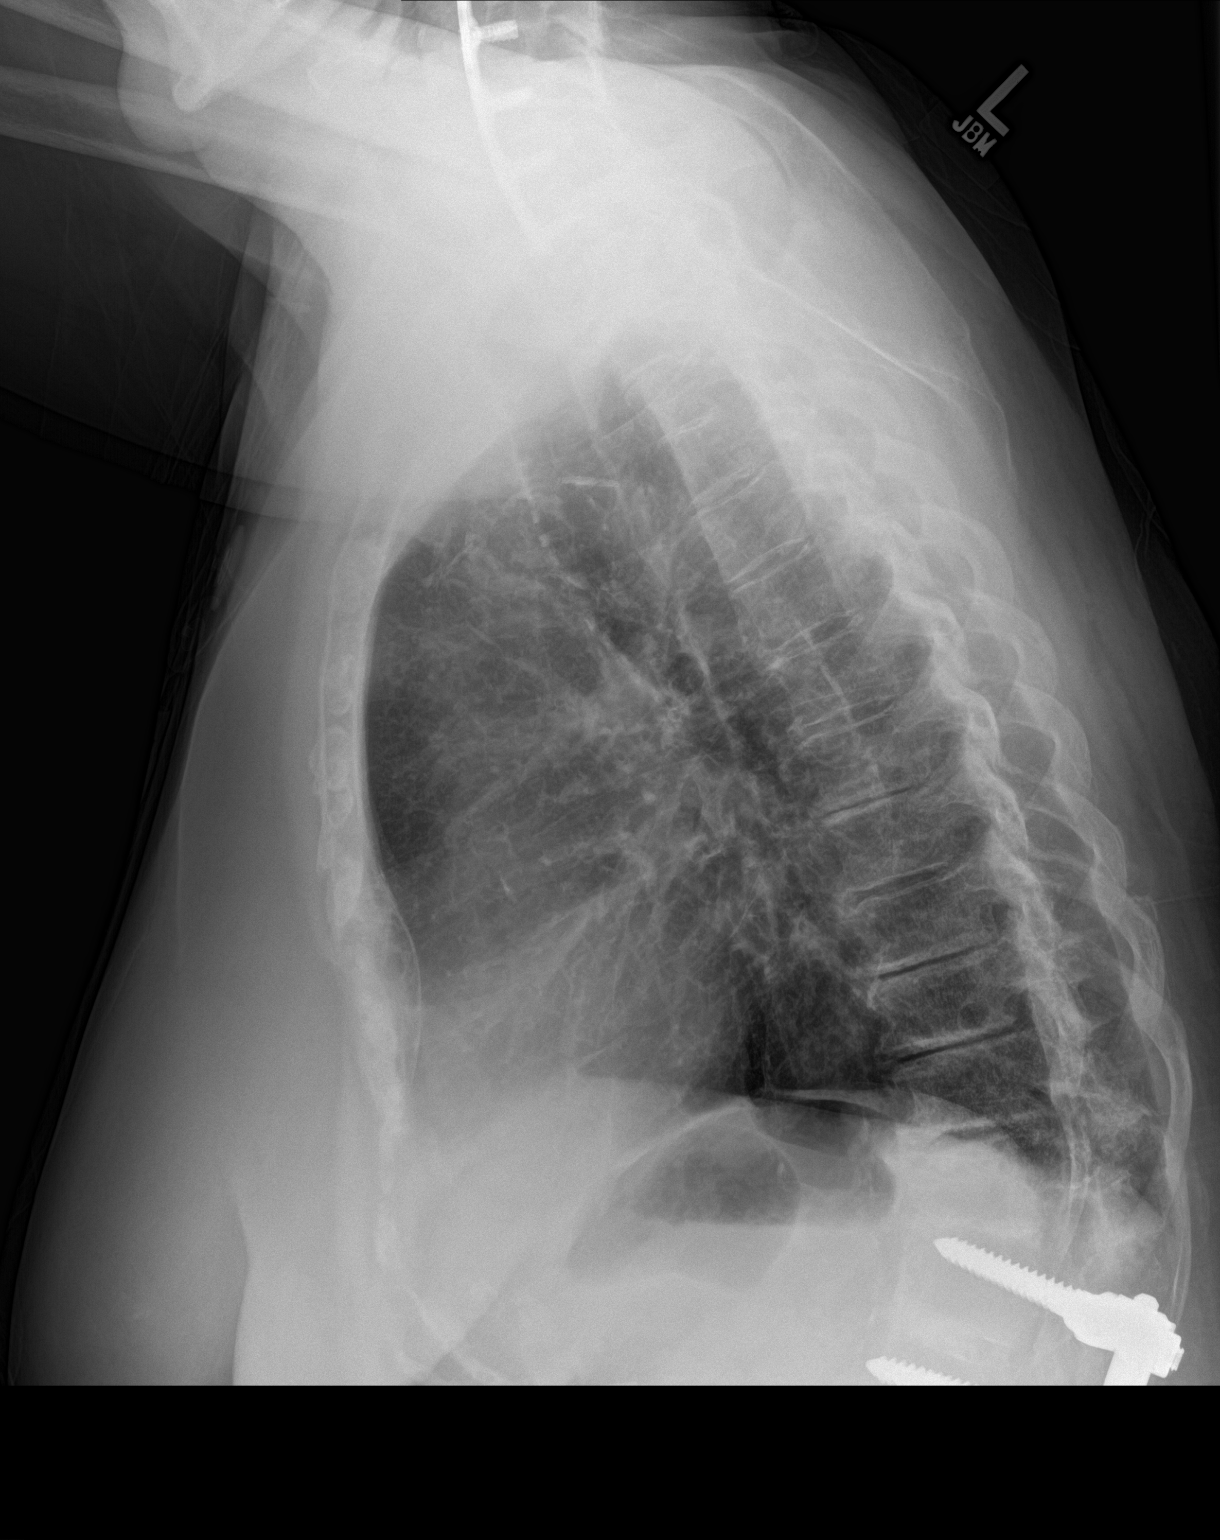

[2 of 2 positions shown; findings below may reference images not displayed]

FINDINGS: Lumbar spine fixation. Cervical spine fixation. Midline trachea.
Borderline cardiomegaly. Atherosclerosis in the transverse aorta. No
pleural effusion or pneumothorax. Mild biapical pleural thickening.
Diffuse pulmonary interstitial thickening. Suspicion of more focal
inferior right upper lobe patchy airspace disease.
IMPRESSION: Possible posterior right upper lobe airspace disease. This is
superimposed upon presumably chronic nonspecific interstitial
thickening. Depending on clinical symptoms, consider radiographic
follow-up in 2-4 days.

Borderline cardiomegaly with aortic atherosclerosis.

## 2017-11-05 IMAGING — CR DG CHEST 1V PORT
1 series · 1 of 1 positions shown · non-contrast
Comparison: 03/13/2016.  03/12/2016.

CLINICAL DATA: Cough and wheezing.

EXAM:
PORTABLE CHEST 1 VIEW

[AP]
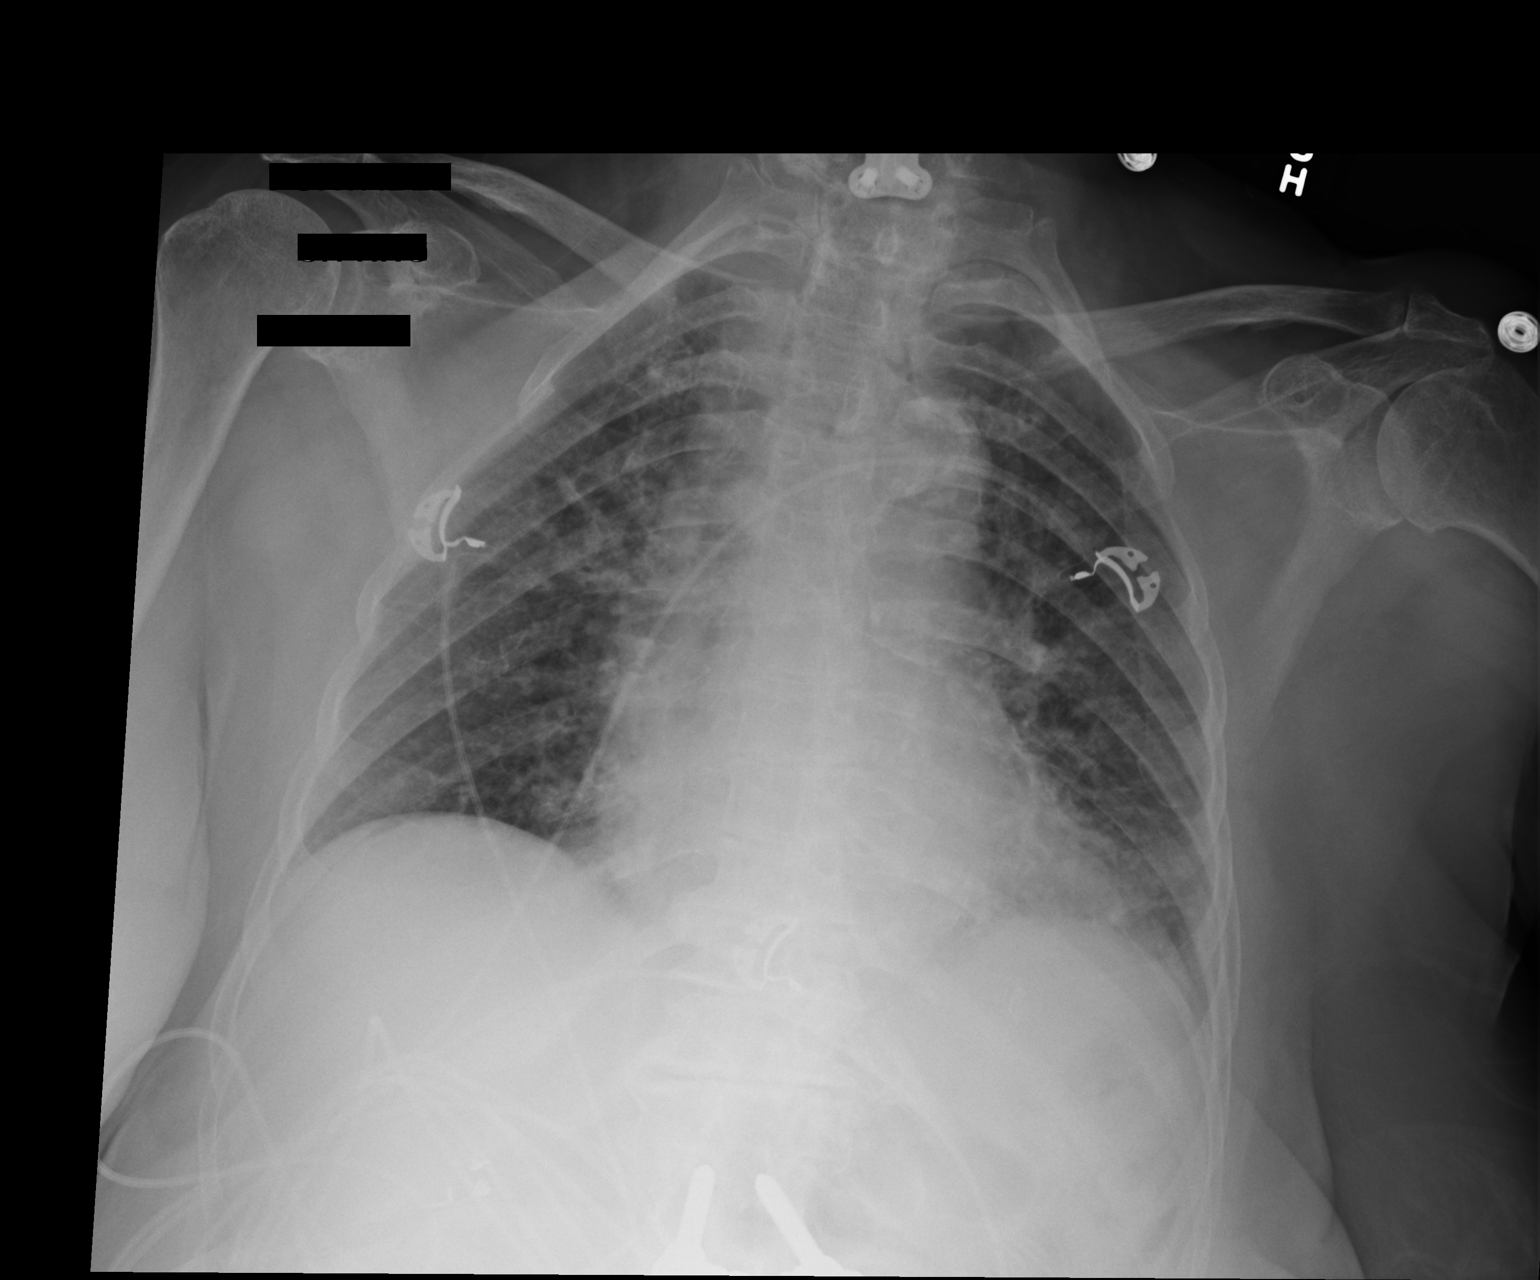

[1 of 1 positions shown; findings below may reference images not displayed]

FINDINGS: Cardiomegaly. Diffuse bilateral pulmonary interstitial prominence
consistent with interstitial edema and/or pneumonitis again noted.
No prominent pleural effusion. No pneumothorax. Prior cervical spine
fusion . Prior lumbar spine fusion.
IMPRESSION: Cardiomegaly with mild bilateral pulmonary interstitial prominence
again noted. Findings consistent with mild interstitial edema and/or
pneumonitis. No change from prior exam .

## 2023-02-13 DEATH — deceased
